# Patient Record
Sex: Female | Born: 1975
Health system: Southern US, Community
[De-identification: ages and names within clinical notes are randomized; demographics above are authoritative.]

## PROBLEM LIST (undated history)

## (undated) DIAGNOSIS — D259 Leiomyoma of uterus, unspecified: Secondary | ICD-10-CM

## (undated) DIAGNOSIS — K219 Gastro-esophageal reflux disease without esophagitis: Secondary | ICD-10-CM

## (undated) DIAGNOSIS — F419 Anxiety disorder, unspecified: Secondary | ICD-10-CM

## (undated) DIAGNOSIS — R51 Headache: Secondary | ICD-10-CM

## (undated) DIAGNOSIS — D649 Anemia, unspecified: Secondary | ICD-10-CM

## (undated) DIAGNOSIS — D509 Iron deficiency anemia, unspecified: Secondary | ICD-10-CM

## (undated) HISTORY — PX: DILATION AND CURETTAGE OF UTERUS: SHX78

## (undated) HISTORY — PX: TOE SURGERY: SHX1073

## (undated) HISTORY — PX: BUNIONECTOMY: SHX129

---

## 2009-07-28 ENCOUNTER — Emergency Department (HOSPITAL_COMMUNITY): Admission: EM | Admit: 2009-07-28 | Discharge: 2009-07-28 | Payer: Self-pay | Admitting: Emergency Medicine

## 2013-01-17 ENCOUNTER — Other Ambulatory Visit: Payer: Self-pay | Admitting: Obstetrics and Gynecology

## 2013-01-17 DIAGNOSIS — D219 Benign neoplasm of connective and other soft tissue, unspecified: Secondary | ICD-10-CM

## 2013-01-26 ENCOUNTER — Ambulatory Visit
Admission: RE | Admit: 2013-01-26 | Discharge: 2013-01-26 | Disposition: A | Discharge status: Home / Self Care | Payer: Managed Care, Other (non HMO) | Source: Ambulatory Visit | Attending: Obstetrics and Gynecology | Admitting: Obstetrics and Gynecology

## 2013-01-26 DIAGNOSIS — D219 Benign neoplasm of connective and other soft tissue, unspecified: Secondary | ICD-10-CM

## 2013-01-26 MED ORDER — GADOBENATE DIMEGLUMINE 529 MG/ML IV SOLN
15.0000 mL | Freq: Once | INTRAVENOUS | Status: AC | PRN
  Administered 2013-01-26: 15 mL via INTRAVENOUS

## 2013-02-05 ENCOUNTER — Other Ambulatory Visit: Payer: Self-pay | Admitting: Obstetrics and Gynecology

## 2013-02-08 ENCOUNTER — Encounter (HOSPITAL_COMMUNITY): Payer: Self-pay | Admitting: Pharmacist

## 2013-02-13 ENCOUNTER — Encounter (HOSPITAL_COMMUNITY)
Admission: RE | Admit: 2013-02-13 | Discharge: 2013-02-13 | Disposition: A | Discharge status: Home / Self Care | Payer: Managed Care, Other (non HMO) | Source: Ambulatory Visit | Attending: Obstetrics and Gynecology | Admitting: Obstetrics and Gynecology

## 2013-02-13 ENCOUNTER — Encounter (HOSPITAL_COMMUNITY): Payer: Self-pay

## 2013-02-13 HISTORY — DX: Anxiety disorder, unspecified: F41.9

## 2013-02-13 HISTORY — DX: Headache: R51

## 2013-02-13 HISTORY — DX: Anemia, unspecified: D64.9

## 2013-02-13 LAB — CBC
HCT: 33.8 % — ABNORMAL LOW (ref 36.0–46.0)
Hemoglobin: 11 g/dL — ABNORMAL LOW (ref 12.0–15.0)
MCHC: 32.5 g/dL (ref 30.0–36.0)
MCV: 80.7 fL (ref 78.0–100.0)
RBC: 4.19 MIL/uL (ref 3.87–5.11)
RDW: 15.5 % (ref 11.5–15.5)
WBC: 4.1 10*3/uL (ref 4.0–10.5)

## 2013-02-13 LAB — ABO/RH: ABO/RH(D): O POS

## 2013-02-13 LAB — TYPE AND SCREEN: Antibody Screen: NEGATIVE

## 2013-02-13 NOTE — Patient Instructions (Addendum)
Your procedure is scheduled on: 02/14/2013  Enter through the Main Entrance of [facility] at: 1015AM  Pick up the phone at the desk and dial [phone].  Call this number if you have problems the morning of surgery: [phone].  Remember: Do NOT eat food: AFTER MIDNIGHT TONIGHT Do NOT drink clear liquids after: AFTER MIDNIGHT TONIGHT Take these medicines the morning of surgery with a SIP OF WATER: XANAX IF NEEDED DAY OF SURGERY  Do NOT wear jewelry (body piercing), make-up, or nail polish. Do NOT wear lotions, powders, or perfumes.  You may wear deoderant. Do NOT shave for 48 hours prior to surgery. Do NOT bring valuables to the hospital. Contacts, dentures, or bridgework may not be worn into surgery. Leave suitcase in car.  After surgery it may be brought to your room.  For patients admitted to the hospital, checkout time is 11:00 AM the day of discharge.

## 2013-02-14 ENCOUNTER — Encounter (HOSPITAL_COMMUNITY): Payer: Managed Care, Other (non HMO) | Admitting: Anesthesiology

## 2013-02-14 ENCOUNTER — Inpatient Hospital Stay (HOSPITAL_COMMUNITY): Payer: Managed Care, Other (non HMO) | Admitting: Anesthesiology

## 2013-02-14 ENCOUNTER — Inpatient Hospital Stay (HOSPITAL_COMMUNITY)
Admission: RE | Admit: 2013-02-14 | Discharge: 2013-02-16 | DRG: 742 | Disposition: A | Discharge status: Home / Self Care | Payer: Managed Care, Other (non HMO) | Source: Ambulatory Visit | Attending: Obstetrics and Gynecology | Admitting: Obstetrics and Gynecology

## 2013-02-14 ENCOUNTER — Encounter (HOSPITAL_COMMUNITY)
Admission: RE | Disposition: A | Discharge status: Home / Self Care | Payer: Self-pay | Source: Ambulatory Visit | Attending: Obstetrics and Gynecology

## 2013-02-14 ENCOUNTER — Encounter (HOSPITAL_COMMUNITY): Payer: Self-pay | Admitting: *Deleted

## 2013-02-14 DIAGNOSIS — D250 Submucous leiomyoma of uterus: Secondary | ICD-10-CM | POA: Diagnosis present

## 2013-02-14 DIAGNOSIS — D251 Intramural leiomyoma of uterus: Principal | ICD-10-CM | POA: Diagnosis present

## 2013-02-14 DIAGNOSIS — N920 Excessive and frequent menstruation with regular cycle: Secondary | ICD-10-CM | POA: Diagnosis present

## 2013-02-14 DIAGNOSIS — N946 Dysmenorrhea, unspecified: Secondary | ICD-10-CM | POA: Diagnosis present

## 2013-02-14 DIAGNOSIS — D252 Subserosal leiomyoma of uterus: Secondary | ICD-10-CM | POA: Diagnosis present

## 2013-02-14 DIAGNOSIS — Z9889 Other specified postprocedural states: Secondary | ICD-10-CM

## 2013-02-14 DIAGNOSIS — D62 Acute posthemorrhagic anemia: Secondary | ICD-10-CM | POA: Diagnosis not present

## 2013-02-14 HISTORY — PX: MYOMECTOMY: SHX85

## 2013-02-14 SURGERY — MYOMECTOMY, ABDOMINAL APPROACH
Anesthesia: General | Site: Abdomen

## 2013-02-14 MED ORDER — DIPHENHYDRAMINE HCL 12.5 MG/5ML PO ELIX: 12.5000 mg | ORAL_SOLUTION | Freq: Four times a day (QID) | ORAL | Status: DC | PRN

## 2013-02-14 MED ORDER — KETOROLAC TROMETHAMINE 30 MG/ML IJ SOLN
INTRAMUSCULAR | Status: AC
  Filled 2013-02-14: qty 1

## 2013-02-14 MED ORDER — CEFAZOLIN SODIUM-DEXTROSE 2-3 GM-% IV SOLR
2.0000 g | INTRAVENOUS | Status: AC
  Administered 2013-02-14: 2 g via INTRAVENOUS

## 2013-02-14 MED ORDER — BUPIVACAINE HCL (PF) 0.25 % IJ SOLN
INTRAMUSCULAR | Status: DC | PRN
  Administered 2013-02-14: 8 mL

## 2013-02-14 MED ORDER — PROPOFOL 10 MG/ML IV EMUL
INTRAVENOUS | Status: AC
  Filled 2013-02-14: qty 20

## 2013-02-14 MED ORDER — VASOPRESSIN 20 UNIT/ML IJ SOLN
INTRAVENOUS | Status: DC | PRN
  Administered 2013-02-14: 14:00:00 via INTRAMUSCULAR

## 2013-02-14 MED ORDER — LACTATED RINGERS IV SOLN
INTRAVENOUS | Status: DC | PRN
  Administered 2013-02-14 (×5): via INTRAVENOUS

## 2013-02-14 MED ORDER — IBUPROFEN 800 MG PO TABS
800.0000 mg | ORAL_TABLET | Freq: Three times a day (TID) | ORAL | Status: DC | PRN
  Administered 2013-02-14 – 2013-02-16 (×4): 800 mg via ORAL
  Filled 2013-02-14 (×4): qty 1

## 2013-02-14 MED ORDER — ONDANSETRON HCL 4 MG/2ML IJ SOLN
INTRAMUSCULAR | Status: AC
  Filled 2013-02-14: qty 2

## 2013-02-14 MED ORDER — ESCITALOPRAM OXALATE 10 MG PO TABS
10.0000 mg | ORAL_TABLET | Freq: Every day | ORAL | Status: DC
  Administered 2013-02-15 – 2013-02-16 (×2): 10 mg via ORAL
  Filled 2013-02-14 (×4): qty 1

## 2013-02-14 MED ORDER — HYDROMORPHONE HCL PF 1 MG/ML IJ SOLN
INTRAMUSCULAR | Status: AC
  Administered 2013-02-14: 0.5 mg via INTRAVENOUS
  Filled 2013-02-14: qty 1

## 2013-02-14 MED ORDER — NEOSTIGMINE METHYLSULFATE 1 MG/ML IJ SOLN
INTRAMUSCULAR | Status: AC
  Filled 2013-02-14: qty 1

## 2013-02-14 MED ORDER — MENTHOL 3 MG MT LOZG: 1.0000 | LOZENGE | OROMUCOSAL | Status: DC | PRN

## 2013-02-14 MED ORDER — DEXAMETHASONE SODIUM PHOSPHATE 10 MG/ML IJ SOLN
INTRAMUSCULAR | Status: AC
  Filled 2013-02-14: qty 1

## 2013-02-14 MED ORDER — ROCURONIUM BROMIDE 100 MG/10ML IV SOLN
INTRAVENOUS | Status: AC
  Filled 2013-02-14: qty 1

## 2013-02-14 MED ORDER — FENTANYL CITRATE 0.05 MG/ML IJ SOLN
INTRAMUSCULAR | Status: DC | PRN
  Administered 2013-02-14 (×9): 50 ug via INTRAVENOUS

## 2013-02-14 MED ORDER — ONDANSETRON HCL 4 MG/2ML IJ SOLN
INTRAMUSCULAR | Status: DC | PRN
  Administered 2013-02-14: 4 mg via INTRAVENOUS

## 2013-02-14 MED ORDER — MIDAZOLAM HCL 2 MG/2ML IJ SOLN
INTRAMUSCULAR | Status: AC
  Filled 2013-02-14: qty 2

## 2013-02-14 MED ORDER — SCOPOLAMINE 1 MG/3DAYS TD PT72
MEDICATED_PATCH | TRANSDERMAL | Status: AC
  Filled 2013-02-14: qty 1

## 2013-02-14 MED ORDER — BUPIVACAINE HCL (PF) 0.25 % IJ SOLN
INTRAMUSCULAR | Status: AC
  Filled 2013-02-14: qty 30

## 2013-02-14 MED ORDER — PHENYLEPHRINE 40 MCG/ML (10ML) SYRINGE FOR IV PUSH (FOR BLOOD PRESSURE SUPPORT)
PREFILLED_SYRINGE | INTRAVENOUS | Status: AC
  Filled 2013-02-14: qty 5

## 2013-02-14 MED ORDER — SODIUM CHLORIDE 0.9 % IJ SOLN
INTRAMUSCULAR | Status: AC
  Filled 2013-02-14: qty 50

## 2013-02-14 MED ORDER — GLYCOPYRROLATE 0.2 MG/ML IJ SOLN
INTRAMUSCULAR | Status: DC | PRN
  Administered 2013-02-14: 0.6 mg via INTRAVENOUS

## 2013-02-14 MED ORDER — FENTANYL CITRATE 0.05 MG/ML IJ SOLN
INTRAMUSCULAR | Status: AC
Start: 2013-02-14 — End: 2013-02-14
  Filled 2013-02-14: qty 5

## 2013-02-14 MED ORDER — CEFAZOLIN SODIUM-DEXTROSE 2-3 GM-% IV SOLR
INTRAVENOUS | Status: AC
  Filled 2013-02-14: qty 50

## 2013-02-14 MED ORDER — DEXTROSE IN LACTATED RINGERS 5 % IV SOLN
INTRAVENOUS | Status: DC
  Administered 2013-02-14 – 2013-02-15 (×3): via INTRAVENOUS

## 2013-02-14 MED ORDER — FENTANYL CITRATE 0.05 MG/ML IJ SOLN
INTRAMUSCULAR | Status: AC
  Filled 2013-02-14: qty 5

## 2013-02-14 MED ORDER — METOCLOPRAMIDE HCL 5 MG/ML IJ SOLN: 10.0000 mg | Freq: Once | INTRAMUSCULAR | Status: DC | PRN

## 2013-02-14 MED ORDER — VASOPRESSIN 20 UNIT/ML IJ SOLN
INTRAMUSCULAR | Status: AC
  Filled 2013-02-14: qty 3

## 2013-02-14 MED ORDER — OXYCODONE-ACETAMINOPHEN 5-325 MG PO TABS
1.0000 | ORAL_TABLET | ORAL | Status: DC | PRN
  Administered 2013-02-15 – 2013-02-16 (×6): 2 via ORAL
  Filled 2013-02-14 (×6): qty 2

## 2013-02-14 MED ORDER — SCOPOLAMINE 1 MG/3DAYS TD PT72
1.0000 | MEDICATED_PATCH | TRANSDERMAL | Status: DC
  Administered 2013-02-14: 1.5 mg via TRANSDERMAL

## 2013-02-14 MED ORDER — PANTOPRAZOLE SODIUM 40 MG PO TBEC
40.0000 mg | DELAYED_RELEASE_TABLET | Freq: Every day | ORAL | Status: DC
  Administered 2013-02-15 – 2013-02-16 (×2): 40 mg via ORAL
  Filled 2013-02-14 (×4): qty 1

## 2013-02-14 MED ORDER — HYDROMORPHONE HCL PF 1 MG/ML IJ SOLN
0.2500 mg | INTRAMUSCULAR | Status: DC | PRN
  Administered 2013-02-14 (×3): 0.5 mg via INTRAVENOUS

## 2013-02-14 MED ORDER — SODIUM CHLORIDE 0.9 % IJ SOLN
INTRAMUSCULAR | Status: AC
  Filled 2013-02-14: qty 40

## 2013-02-14 MED ORDER — MIDAZOLAM HCL 2 MG/2ML IJ SOLN
INTRAMUSCULAR | Status: DC | PRN
  Administered 2013-02-14: 2 mg via INTRAVENOUS

## 2013-02-14 MED ORDER — ONDANSETRON HCL 4 MG PO TABS
4.0000 mg | ORAL_TABLET | Freq: Four times a day (QID) | ORAL | Status: DC | PRN
  Administered 2013-02-15: 4 mg via ORAL
  Filled 2013-02-14: qty 1

## 2013-02-14 MED ORDER — LIDOCAINE HCL (CARDIAC) 20 MG/ML IV SOLN
INTRAVENOUS | Status: DC | PRN
  Administered 2013-02-14: 60 mg via INTRAVENOUS

## 2013-02-14 MED ORDER — ACETAMINOPHEN 10 MG/ML IV SOLN
1000.0000 mg | Freq: Once | INTRAVENOUS | Status: AC
  Administered 2013-02-14: 1000 mg via INTRAVENOUS
  Filled 2013-02-14: qty 100

## 2013-02-14 MED ORDER — ONDANSETRON HCL 4 MG/2ML IJ SOLN: 4.0000 mg | Freq: Four times a day (QID) | INTRAMUSCULAR | Status: DC | PRN

## 2013-02-14 MED ORDER — DIPHENHYDRAMINE HCL 50 MG/ML IJ SOLN: 12.5000 mg | Freq: Four times a day (QID) | INTRAMUSCULAR | Status: DC | PRN

## 2013-02-14 MED ORDER — HYDROMORPHONE 0.3 MG/ML IV SOLN
INTRAVENOUS | Status: DC
  Administered 2013-02-14: 1.19 mg via INTRAVENOUS
  Administered 2013-02-14: 17:00:00 via INTRAVENOUS
  Administered 2013-02-15: 0.599 mg via INTRAVENOUS
  Administered 2013-02-15: 0.4 mg via INTRAVENOUS
  Filled 2013-02-14: qty 25

## 2013-02-14 MED ORDER — PHENYLEPHRINE HCL 10 MG/ML IJ SOLN
INTRAMUSCULAR | Status: DC | PRN
  Administered 2013-02-14 (×8): 40 ug via INTRAVENOUS

## 2013-02-14 MED ORDER — LACTATED RINGERS IV SOLN
INTRAVENOUS | Status: DC
  Administered 2013-02-14 (×5): via INTRAVENOUS

## 2013-02-14 MED ORDER — DEXAMETHASONE SODIUM PHOSPHATE 10 MG/ML IJ SOLN
INTRAMUSCULAR | Status: DC | PRN
  Administered 2013-02-14: 10 mg via INTRAVENOUS

## 2013-02-14 MED ORDER — NEOSTIGMINE METHYLSULFATE 1 MG/ML IJ SOLN
INTRAMUSCULAR | Status: DC | PRN
  Administered 2013-02-14: 3 mg via INTRAVENOUS

## 2013-02-14 MED ORDER — ZOLPIDEM TARTRATE 5 MG PO TABS: 5.0000 mg | ORAL_TABLET | Freq: Every evening | ORAL | Status: DC | PRN

## 2013-02-14 MED ORDER — METHYLENE BLUE 1 % INJ SOLN
INTRAMUSCULAR | Status: AC
  Filled 2013-02-14: qty 10

## 2013-02-14 MED ORDER — SODIUM CHLORIDE 0.9 % IJ SOLN: 9.0000 mL | INTRAMUSCULAR | Status: DC | PRN

## 2013-02-14 MED ORDER — PROPOFOL 10 MG/ML IV BOLUS
INTRAVENOUS | Status: DC | PRN
  Administered 2013-02-14: 150 mg via INTRAVENOUS

## 2013-02-14 MED ORDER — NALOXONE HCL 0.4 MG/ML IJ SOLN: 0.4000 mg | INTRAMUSCULAR | Status: DC | PRN

## 2013-02-14 MED ORDER — GLYCOPYRROLATE 0.2 MG/ML IJ SOLN
INTRAMUSCULAR | Status: AC
  Filled 2013-02-14: qty 3

## 2013-02-14 MED ORDER — HYDROMORPHONE HCL PF 1 MG/ML IJ SOLN
INTRAMUSCULAR | Status: AC
  Filled 2013-02-14: qty 1

## 2013-02-14 MED ORDER — ROCURONIUM BROMIDE 100 MG/10ML IV SOLN
INTRAVENOUS | Status: DC | PRN
  Administered 2013-02-14: 40 mg via INTRAVENOUS
  Administered 2013-02-14 (×3): 10 mg via INTRAVENOUS
  Administered 2013-02-14: 5 mg via INTRAVENOUS
  Administered 2013-02-14: 10 mg via INTRAVENOUS

## 2013-02-14 MED ORDER — HYDROMORPHONE HCL PF 1 MG/ML IJ SOLN: 0.2000 mg | INTRAMUSCULAR | Status: DC | PRN

## 2013-02-14 MED ORDER — MEPERIDINE HCL 25 MG/ML IJ SOLN: 6.2500 mg | INTRAMUSCULAR | Status: DC | PRN

## 2013-02-14 MED ORDER — LIDOCAINE HCL (CARDIAC) 20 MG/ML IV SOLN
INTRAVENOUS | Status: AC
  Filled 2013-02-14: qty 5

## 2013-02-14 SURGICAL SUPPLY — 45 items
BARRIER ADHS 3X4 INTERCEED (GAUZE/BANDAGES/DRESSINGS) ×6 IMPLANT
CANISTER SUCT 3000ML (MISCELLANEOUS) ×2 IMPLANT
CATH FOLEY 2WAY  3CC  8FR (CATHETERS)
CATH FOLEY 2WAY 3CC 8FR (CATHETERS) IMPLANT
CLOTH BEACON ORANGE TIMEOUT ST (SAFETY) ×2 IMPLANT
CONT PATH 16OZ SNAP LID 3702 (MISCELLANEOUS) ×2 IMPLANT
CONT SPEC PATH 64OZ SNAP LID (MISCELLANEOUS) ×2 IMPLANT
DECANTER SPIKE VIAL GLASS SM (MISCELLANEOUS) ×2 IMPLANT
DRAPE CESAREAN BIRTH W POUCH (DRAPES) ×2 IMPLANT
DRSG OPSITE POSTOP 4X10 (GAUZE/BANDAGES/DRESSINGS) ×2 IMPLANT
ELECT NEEDLE TIP 2.8 STRL (NEEDLE) ×2 IMPLANT
FILTER STRAW FLUID ASPIR (MISCELLANEOUS) IMPLANT
GAUZE SPONGE 4X4 12PLY STRL LF (GAUZE/BANDAGES/DRESSINGS) IMPLANT
GAUZE SPONGE 4X4 16PLY XRAY LF (GAUZE/BANDAGES/DRESSINGS) IMPLANT
GLOVE BIOGEL PI IND STRL 7.0 (GLOVE) ×3 IMPLANT
GLOVE BIOGEL PI INDICATOR 7.0 (GLOVE) ×3
GLOVE ECLIPSE 6.5 STRL STRAW (GLOVE) ×2 IMPLANT
GOWN PREVENTION PLUS LG XLONG (DISPOSABLE) ×6 IMPLANT
IV STOPCOCK 4 WAY 40  W/Y SET (IV SOLUTION)
IV STOPCOCK 4 WAY 40 W/Y SET (IV SOLUTION) IMPLANT
NEEDLE HYPO 25X1 1.5 SAFETY (NEEDLE) ×2 IMPLANT
NEEDLE SPNL 22GX9.0 ACCUTG (NEEDLE) IMPLANT
NS IRRIG 1000ML POUR BTL (IV SOLUTION) ×2 IMPLANT
PACK ABDOMINAL GYN (CUSTOM PROCEDURE TRAY) ×2 IMPLANT
PAD OB MATERNITY 4.3X12.25 (PERSONAL CARE ITEMS) ×2 IMPLANT
SPONGE LAP 18X18 X RAY DECT (DISPOSABLE) ×10 IMPLANT
STAPLER VISISTAT 35W (STAPLE) ×2 IMPLANT
SUT MNCRL AB 4-0 PS2 18 (SUTURE) IMPLANT
SUT MON AB 3-0 SH 27 (SUTURE) ×4
SUT MON AB 3-0 SH27 (SUTURE) ×4 IMPLANT
SUT PLAIN 2 0 (SUTURE) ×1
SUT PLAIN ABS 2-0 CT1 27XMFL (SUTURE) ×1 IMPLANT
SUT VIC AB 0 CT1 18XCR BRD8 (SUTURE) ×3 IMPLANT
SUT VIC AB 0 CT1 36 (SUTURE) ×4 IMPLANT
SUT VIC AB 0 CT1 8-18 (SUTURE) ×3
SUT VIC AB 2-0 CT1 27 (SUTURE) ×1
SUT VIC AB 2-0 CT1 TAPERPNT 27 (SUTURE) ×1 IMPLANT
SUT VIC AB 2-0 SH 27 (SUTURE) ×2
SUT VIC AB 2-0 SH 27XBRD (SUTURE) ×2 IMPLANT
SUT VIC AB 4-0 PS2 27 (SUTURE) IMPLANT
SYR 50ML LL SCALE MARK (SYRINGE) ×2 IMPLANT
SYR CONTROL 10ML LL (SYRINGE) ×2 IMPLANT
TOWEL OR 17X24 6PK STRL BLUE (TOWEL DISPOSABLE) ×4 IMPLANT
TRAY FOLEY CATH 14FR (SET/KITS/TRAYS/PACK) ×2 IMPLANT
WATER STERILE IRR 1000ML POUR (IV SOLUTION) IMPLANT

## 2013-02-14 NOTE — Anesthesia Preprocedure Evaluation (Signed)
Anesthesia Evaluation  Patient identified by MRN, date of birth, ID band Patient awake    Reviewed: Allergy & Precautions, H&P , NPO status , Patient's Chart, lab work & pertinent test results  Airway Mallampati: II TM Distance: >3 FB Neck ROM: full    Dental no notable dental hx. (+) Teeth Intact   Pulmonary neg pulmonary ROS,  breath sounds clear to auscultation  Pulmonary exam normal       Cardiovascular negative cardio ROS  Rhythm:regular Rate:Normal     Neuro/Psych  Headaches, negative psych ROS   GI/Hepatic negative GI ROS, Neg liver ROS, GERD-  Medicated,  Endo/Other  negative endocrine ROS  Renal/GU negative Renal ROS  negative genitourinary   Musculoskeletal negative musculoskeletal ROS (+)   Abdominal Normal abdominal exam  (+)   Peds  Hematology  (+) anemia ,   Anesthesia Other Findings   Reproductive/Obstetrics Fibroid uterus                           Anesthesia Physical Anesthesia Plan  ASA: II  Anesthesia Plan: General   Post-op Pain Management:    Induction: Intravenous  Airway Management Planned: Oral ETT  Additional Equipment:   Intra-op Plan:   Post-operative Plan: Extubation in OR  Informed Consent: I have reviewed the patients History and Physical, chart, labs and discussed the procedure including the risks, benefits and alternatives for the proposed anesthesia with the patient or authorized representative who has indicated his/her understanding and acceptance.   Dental advisory given  Plan Discussed with: Anesthesiologist, CRNA and Surgeon  Anesthesia Plan Comments:         Anesthesia Quick Evaluation

## 2013-02-14 NOTE — Brief Op Note (Signed)
02/14/2013  2:39 PM  PATIENT:  Diana Craig  37 y.o. female  PRE-OPERATIVE DIAGNOSIS:  Menorrhagia, dysmenorrhea, uterine fibroids  POST-OPERATIVE DIAGNOSIS:  IM/SS/SM fibroids, dysmenorrhea, menorrhagia  PROCEDURE:  Exploratory laparotomy, multiple myomectomy  SURGEON:  Surgeon(s) and Role:    * Dashon Mcintire Cathie Beams, MD - Primary  PHYSICIAN ASSISTANT:   ASSISTANTS: Marlinda Mike, CNM   ANESTHESIA:   general Findings: multiple uterine fibroids, nl tubes and ovaries EBL:  Total I/O In: 3100 [I.V.:3100] Out: 775 [Urine:175; Blood:600]  BLOOD ADMINISTERED:none  DRAINS: none   LOCAL MEDICATIONS USED:  MARCAINE     SPECIMEN:  Source of Specimen:  myoma x 52  DISPOSITION OF SPECIMEN:  PATHOLOGY  COUNTS:  YES  TOURNIQUET:  * No tourniquets in log *  DICTATION: .Other Dictation: Dictation Number [id number]  PLAN OF CARE: Admit to inpatient   PATIENT DISPOSITION:  PACU - hemodynamically stable.   Delay start of Pharmacological VTE agent (>24hrs) due to surgical blood loss or risk of bleeding: no

## 2013-02-14 NOTE — Transfer of Care (Signed)
Immediate Anesthesia Transfer of Care Note  Patient: Diana Craig  Procedure(s) Performed: Procedure(s) with comments: Exploratory Laparotomy MYOMECTOMY (N/A) - 2 1/2 hrs.  Patient Location: PACU  Anesthesia Type:General  Level of Consciousness: awake, alert , oriented and patient cooperative  Airway & Oxygen Therapy: Patient Spontanous Breathing and Patient connected to nasal cannula oxygen  Post-op Assessment: Report given to PACU RN and Post -op Vital signs reviewed and stable  Post vital signs: Reviewed and stable  Complications: No apparent anesthesia complications

## 2013-02-14 NOTE — Anesthesia Postprocedure Evaluation (Signed)
Anesthesia Post Note  Patient: Diana Craig  Procedure(s) Performed: Procedure(s) (LRB): Exploratory Laparotomy MYOMECTOMY (N/A)  Anesthesia type: General  Patient location: PACU  Post pain: Pain level controlled  Post assessment: Post-op Vital signs reviewed  Last Vitals:  Filed Vitals:   02/14/13 1515  BP: 111/57  Pulse: 73  Temp:   Resp: 14    Post vital signs: Reviewed  Level of consciousness: sedated  Complications: No apparent anesthesia complications

## 2013-02-15 ENCOUNTER — Encounter (HOSPITAL_COMMUNITY): Payer: Self-pay | Admitting: Obstetrics and Gynecology

## 2013-02-15 LAB — CBC
HCT: 25.1 % — ABNORMAL LOW (ref 36.0–46.0)
Hemoglobin: 8.1 g/dL — ABNORMAL LOW (ref 12.0–15.0)
MCH: 26.3 pg (ref 26.0–34.0)
MCHC: 32.3 g/dL (ref 30.0–36.0)
RBC: 3.08 MIL/uL — ABNORMAL LOW (ref 3.87–5.11)
WBC: 8.4 10*3/uL (ref 4.0–10.5)

## 2013-02-15 MED ORDER — POLYSACCHARIDE IRON COMPLEX 150 MG PO CAPS
150.0000 mg | ORAL_CAPSULE | Freq: Two times a day (BID) | ORAL | Status: DC
  Administered 2013-02-15 – 2013-02-16 (×3): 150 mg via ORAL
  Filled 2013-02-15 (×5): qty 1

## 2013-02-15 NOTE — Op Note (Signed)
NAMECAMY, LEDER         ACCOUNT NO.:  [id number]  MEDICAL RECORD NO.:  [id number]  LOCATION:  9303                          FACILITY:  [facility]  PHYSICIAN:  Maxie Better, M.D.DATE OF BIRTH:  December 21, 1975  DATE OF PROCEDURE:  02/14/2013 DATE OF DISCHARGE:                              OPERATIVE REPORT   PREOPERATIVE DIAGNOSES:  Menorrhagia, dysmenorrhea, uterine fibroids.  PROCEDURES:  Exploratory laparotomy, multiple myomectomy.  POSTOPERATIVE DIAGNOSES:  Intramural subserosal submucosal fibroid menorrhagia, dysmenorrhea.  ANESTHESIA:  General.  SURGEON:  Maxie Better, MD.  ASSISTANTMarlinda Mike, CNM  DESCRIPTION OF PROCEDURE:  Under adequate general anesthesia, the patient was placed in a supine position.  She was sterilely prepped and draped in the usual fashion.  An indwelling Foley catheter was sterilely placed.  A 0.25% Marcaine was injected along the planned Pfannenstiel skin incision site.  Pfannenstiel skin incision was made, carried down to the rectus fascia.  Rectus fascia was opened transversely.  Rectus fascia was then bluntly and sharply dissected off the rectus muscle in superior and inferior fashion.  The rectus muscle was split in the midline.  The parietal peritoneum was entered sharply and extended.  The uterus was then exteriorized.  Normal tubes and ovaries were noted bilaterally.  The fundus was identified using the fallopian tubes as the guidelines.  Multiple fibroids were noted of the posterior as well as the anterior aspect of the uterus.  Dilute solution of Pitressin was injected in midline posteriorly along the midline.  An incision was then made and multiple fibroids were removed through that incision.  An incision was then extended just below the fundal region posteriorly and additional fibroids were removed at that time.  Once fibroids that could be removed through that incision that had been removed, 0 Vicryl figure- of-eight  suture was then used to close the spaces of the defect carried up to the serosal surface which was then closed with 3-0 Monocryl suture in a baseball stitch.  Attention was then turned to the anterior aspect of the uterus.  Additional dilute Pitressin was also injected and a vertical incision was made over the prominent fibroid through that incision.  All the fibroids were also removed.  The endometrial cavity was noted to be entered posteriorly or anteriorly and when all palpable fibroids was able to be removed through that incision the incision was then closed with interrupted 0 Vicryl figure-of-eight sutures carried up to the serosal surface, then also closed with 3-0 Monocryl sutures.  Deep spaces were closed with interrupted 0 Vicryl sutures.  There was a fibroid on the left posterior aspect of the uterus above the utero- ovarian ligaments, and this was also excised in the right posterior lower uterine segment about 4 cm fibroid was also injected with a dilute solution of Pitressin.  Horizontal incision was then made and the fibroid enucleated and additional fibroids below that was also removed through same incision. Deep spaces were also closed with 0 Vicryl figure-of-eight sutures, carried up to the serosal surface, that also was then closed with 3-0 Monocryl suture.  Once good hemostasis was noted throughout and all palpable fibroids removed, the pelvis was irrigated and suctioned of debris.  Total of 52 fibroids were  removed.  Interceed x2 was placed in the lower portion of the incisions posteriorly and one was also placed anteriorly as well.  The uterus was returned to the abdomen initially with Interceed placed posteriorly and the other Interceed placed anteriorly once the uterus was returned to the abdomen.  The parietal peritoneum was then closed with 2-0 Vicryl.  The rectus fascia was closed with 0 Vicryl x2.  The subcutaneous areas irrigated, small bleeders cauterized.   Interrupted 2-0 plain sutures placed and the skin approximated with Ethicon staples.  SPECIMEN:  Myoma x2 was sent to pathology.  ESTIMATED BLOOD LOSS:  600 mL.  FLUID:  2 L.  URINE OUTPUT:  175 mL.  COUNTS:  Sponge and instrument counts x2 was correct.  COMPLICATION:  None.  The patient tolerated procedure well, was transferred to recovery in stable condition.  She will need a cesarean section for future deliveries.     Maxie Better, M.D.     [place]/MEDQ  D:  02/14/2013  T:  02/15/2013  Job:  [id number]

## 2013-02-15 NOTE — Anesthesia Postprocedure Evaluation (Signed)
  Anesthesia Post-op Note  Patient: Diana Craig  Procedure(s) Performed: Procedure(s) with comments: Exploratory Laparotomy MYOMECTOMY (N/A) - 2 1/2 hrs.  Patient Location: Women's Unit  Anesthesia Type:General  Level of Consciousness: awake, alert  and oriented  Airway and Oxygen Therapy: Patient Spontanous Breathing and Patient connected to nasal cannula oxygen  Post-op Pain: mild  Post-op Assessment: Post-op Vital signs reviewed and Patient's Cardiovascular Status Stable  Post-op Vital Signs: Reviewed and stable  Complications: No apparent anesthesia complications

## 2013-02-15 NOTE — Progress Notes (Signed)
Subjective: Patient reports incisional pain.   No flatus as yet. C/o temporal h/a. No void as yet  Objective: I have reviewed patient's vital signs.  vital signs, intake and output, medications and labs. Filed Vitals:   02/15/13 0621  BP: 107/61  Pulse: 70  Temp: 98.4 F (36.9 C)  Resp: 14   I/O last 3 completed shifts: In: 4550 [I.V.:4550] Out: 1650 [Urine:1050; Blood:600]    Lab Results  Component Value Date   WBC 8.4 02/15/2013   HGB 8.1* 02/15/2013   HCT 25.1* 02/15/2013   MCV 81.5 02/15/2013   PLT 188 02/15/2013   No results found for this basename: CREATININE    EXAM General: alert, cooperative and mild distress Resp: clear to auscultation bilaterally Cardio: regular rate and rhythm, S1, S2 normal, no murmur, click, rub or gallop GI: soft nondistended hypoactive BS incision intact small stain on right Extremities: no edema, redness or tenderness in the calves or thighs Vaginal Bleeding: minimal  Assessment: s/p Procedure(s): Exploratory Laparotomy MYOMECTOMY: stable and anemia Anemia related to surgery blood loss  Plan: Advance diet Encourage ambulation Advance to PO medication Discontinue IV fluids  Start iron supplement with flatus  LOS: 1 day    Ishmel Acevedo A, MD 02/15/2013 7:55 AM    02/15/2013, 7:55 AM

## 2013-02-16 MED ORDER — OXYCODONE-ACETAMINOPHEN 5-325 MG PO TABS: 1.0000 | ORAL_TABLET | ORAL | Status: DC | PRN

## 2013-02-16 MED ORDER — IBUPROFEN 800 MG PO TABS: 800.0000 mg | ORAL_TABLET | Freq: Four times a day (QID) | ORAL | Status: DC | PRN

## 2013-02-16 NOTE — Progress Notes (Signed)
Subjective: Patient reports tolerating PO, + flatus and no problems voiding.    Objective: I have reviewed patient's vital signs.  vital signs, intake and output and labs. Filed Vitals:   02/16/13 1200  BP: 109/66  Pulse: 72  Temp: 98.3 F (36.8 C)  Resp: 18   I/O last 3 completed shifts: In: 1850 [P.O.:600; I.V.:1250] Out: 1850 [Urine:1850]    Lab Results  Component Value Date   WBC 8.4 02/15/2013   HGB 8.1* 02/15/2013   HCT 25.1* 02/15/2013   MCV 81.5 02/15/2013   PLT 188 02/15/2013   No results found for this basename: CREATININE    EXAM General: alert, cooperative and no distress Resp: clear to auscultation bilaterally Cardio: regular rate and rhythm, S1, S2 normal, no murmur, click, rub or gallop GI: incision: clean, dry and intact and soft nondistended (+) BS Extremities: no edema, redness or tenderness in the calves or thighs Vaginal Bleeding: minimal  Assessment: s/p Procedure(s): Exploratory Laparotomy MYOMECTOMY: stable, progressing well, tolerating diet and anemia Anemia due to acute blood loss and iron deficiency anemia Plan: Advance diet Advance to PO medication Discontinue IV fluids Discharge home Staple removal in office 12/`16 or 12/17 D/c instructions reviewed Continue iron tablet at home Script: percocet, motrin   LOS: 2 days    Gordie Crumby A, MD 02/16/2013 4:37 PM    02/16/2013, 4:37 PM

## 2013-02-16 NOTE — Progress Notes (Signed)
Discharge instructions reviewed with patient.  Patient states understanding of home care, medications, activity, signs/symptoms to report to MD and return MD office visit.  No home equipment needed.  Patient ambulated for discharge in stable condition with staff without incident.  

## 2013-02-16 NOTE — Discharge Summary (Signed)
Physician Discharge Summary  Patient ID: Diana Craig MRN: [id number] DOB/AGE: September 23, 1975 37 y.o.  Admit date: 02/14/2013 Discharge date: 02/16/2013  Admission Diagnoses: menorrhagia, dysmenorrhea, uterine fibroids  Discharge Diagnoses: menorrhagia, dysmenorrhea, SS/IM/SM uterine fibroids Active Problems:   S/P myomectomy   Discharged Condition: stable  Hospital Course: pt was admitted to [facility]. She underwent exploratory laparotomy, multiple myomectomy. Pt had uncomplicated postop course  Consults: None  Significant Diagnostic Studies: labs:  CBC    Component Value Date/Time   WBC 8.4 02/15/2013 0525   RBC 3.08* 02/15/2013 0525   HGB 8.1* 02/15/2013 0525   HCT 25.1* 02/15/2013 0525   PLT 188 02/15/2013 0525   MCV 81.5 02/15/2013 0525   MCH 26.3 02/15/2013 0525   MCHC 32.3 02/15/2013 0525   RDW 15.7* 02/15/2013 0525      Treatments: surgery: exploratory laparotomy, multiple myomectomy  Discharge Exam: Blood pressure 109/66, pulse 72, temperature 98.3 F (36.8 C), temperature source Oral, resp. rate 18, height 5\' 3"  (1.6 m), weight 78.926 kg (174 lb), last menstrual period 01/31/2013, SpO2 96.00%. General appearance: alert, cooperative and no distress Resp: clear to auscultation bilaterally Cardio: regular rate and rhythm, S1, S2 normal, no murmur, click, rub or gallop GI: soft, nondistended (+) BS  Pelvic: deferred Extremities: no edema, redness or tenderness in the calves or thighs Incision/Wound:intact dry  Disposition: Final discharge disposition not confirmed  Discharge Orders   Future Orders Complete By Expires   Diet general  As directed    Discharge instructions  As directed    Comments:     Call if temperature greater than equal to 100.4, nothing per vagina for 4-6 weeks or severe nausea vomiting, increased incisional pain , drainage or redness in the incision site, no straining with bowel movements, showers no bath   Discharge patient  As  directed    Discharge wound care:  As directed    Comments:     Staple removal in office Tuesday or wednesday   Increase activity slowly  As directed        Medication List         ALPRAZolam 0.25 MG tablet  Commonly known as:  XANAX  Take 0.25 mg by mouth daily as needed for anxiety.     escitalopram 10 MG tablet  Commonly known as:  LEXAPRO  Take 10 mg by mouth daily.     eszopiclone 3 MG Tabs  Generic drug:  Eszopiclone  Take 3 mg by mouth at bedtime. Take immediately before bedtime     FUSION PLUS Caps  Take 1 capsule by mouth daily.     ibuprofen 800 MG tablet  Commonly known as:  ADVIL,MOTRIN  Take 1 tablet (800 mg total) by mouth every 6 (six) hours as needed.     oxyCODONE-acetaminophen 5-325 MG per tablet  Commonly known as:  PERCOCET/ROXICET  Take 1-2 tablets by mouth every 4 (four) hours as needed for severe pain (moderate to severe pain (when tolerating fluids)).     phentermine 37.5 MG capsule  Take 37.5 mg by mouth every morning.     Vitamin D (Ergocalciferol) 50000 UNITS Caps capsule  Commonly known as:  DRISDOL  Take 50,000 Units by mouth 2 (two) times a week. On Tuesdays and Fridays           Follow-up Information   Follow up with Serita Kyle, MD.   Specialty:  Obstetrics and Gynecology   Contact information:   [address] [place] [address] [phone]  Signed: Haillee Johann A 02/16/2013, 5:40 PM

## 2014-05-08 ENCOUNTER — Ambulatory Visit: Payer: Self-pay | Admitting: Podiatry

## 2014-05-15 ENCOUNTER — Encounter: Payer: Self-pay | Admitting: Podiatry

## 2014-05-15 ENCOUNTER — Ambulatory Visit (INDEPENDENT_AMBULATORY_CARE_PROVIDER_SITE_OTHER): Payer: Managed Care, Other (non HMO) | Admitting: Podiatry

## 2014-05-15 ENCOUNTER — Ambulatory Visit: Payer: Self-pay

## 2014-05-15 VITALS — BP 106/68 | HR 84 | Resp 16

## 2014-05-15 DIAGNOSIS — M898X0 Other specified disorders of bone, multiple sites: Secondary | ICD-10-CM

## 2014-05-15 DIAGNOSIS — Q786 Multiple congenital exostoses: Secondary | ICD-10-CM

## 2014-05-15 DIAGNOSIS — M2012 Hallux valgus (acquired), left foot: Secondary | ICD-10-CM | POA: Diagnosis not present

## 2014-05-15 DIAGNOSIS — M79674 Pain in right toe(s): Secondary | ICD-10-CM | POA: Diagnosis not present

## 2014-05-15 DIAGNOSIS — M79675 Pain in left toe(s): Secondary | ICD-10-CM | POA: Diagnosis not present

## 2014-05-15 DIAGNOSIS — M21612 Bunion of left foot: Secondary | ICD-10-CM

## 2014-05-15 NOTE — Progress Notes (Signed)
   Subjective:    Patient ID: Diana Craig, female    DOB: Aug 02, 1975, 39 y.o.   MRN: [id number]  HPI  Pt presents with pain in right toes 2nd met and pain in left 2,3 met, both had prior surgeries  Review of Systems  All other systems reviewed and are negative.      Objective:   Physical Exam        Assessment & Plan:

## 2014-05-15 NOTE — Patient Instructions (Signed)
Pre-Operative Instructions  Congratulations, you have decided to take an important step to improving your quality of life.  You can be assured that the doctors of [facility] will be with you every step of the way.  1. Plan to be at the surgery center/hospital at least 1 (one) hour prior to your scheduled time unless otherwise directed by the surgical center/hospital staff.  You must have a responsible adult accompany you, remain during the surgery and drive you home.  Make sure you have directions to the surgical center/hospital and know how to get there on time. 2. For hospital based surgery you will need to obtain a history and physical form from your family physician within 1 month prior to the date of surgery- we will give you a form for you primary physician.  3. We make every effort to accommodate the date you request for surgery.  There are however, times where surgery dates or times have to be moved.  We will contact you as soon as possible if a change in schedule is required.   4. No Aspirin/Ibuprofen for one week before surgery.  If you are on aspirin, any non-steroidal anti-inflammatory medications (Mobic, Aleve, Ibuprofen) you should stop taking it 7 days prior to your surgery.  You make take Tylenol  For pain prior to surgery.  5. Medications- If you are taking daily heart and blood pressure medications, seizure, reflux, allergy, asthma, anxiety, pain or diabetes medications, make sure the surgery center/hospital is aware before the day of surgery so they may notify you which medications to take or avoid the day of surgery. 6. No food or drink after midnight the night before surgery unless directed otherwise by surgical center/hospital staff. 7. No alcoholic beverages 24 hours prior to surgery.  No smoking 24 hours prior to or 24 hours after surgery. 8. Wear loose pants or shorts- loose enough to fit over bandages, boots, and casts. 9. No slip on shoes, sneakers are best. 10. Bring  your boot with you to the surgery center/hospital.  Also bring crutches or a walker if your physician has prescribed it for you.  If you do not have this equipment, it will be provided for you after surgery. 11. If you have not been contracted by the surgery center/hospital by the day before your surgery, call to confirm the date and time of your surgery. 12. Leave-time from work may vary depending on the type of surgery you have.  Appropriate arrangements should be made prior to surgery with your employer. 13. Prescriptions will be provided immediately following surgery by your doctor.  Have these filled as soon as possible after surgery and take the medication as directed. 14. Remove nail polish on the operative foot. 15. Wash the night before surgery.  The night before surgery wash the foot and leg well with the antibacterial soap provided and water paying special attention to beneath the toenails and in between the toes.  Rinse thoroughly with water and dry well with a towel.  Perform this wash unless told not to do so by your physician.  Enclosed: 1 Ice pack (please put in freezer the night before surgery)   1 Hibiclens skin cleaner   Pre-op Instructions  If you have any questions regarding the instructions, do not hesitate to call our office.  [place]: [address] , [place] [address] [phone]  [place]: [address] [place], [place] [address] [phone]  [place]: [address]  [place], [place] [address] [phone]  Dr. Richard   Tuchman DPM, Dr. Norman Regal DPM Dr. Richard Sikora DPM, Dr. M. Todd Hyatt DPM, Dr. Kathryn Egerton DPM 

## 2014-05-16 NOTE — Progress Notes (Signed)
Subjective:     Patient ID: Diana Craig, female   DOB: 1975/06/22, 39 y.o.   MRN: [id number]  HPI patient presents stating I have had a lot of discomfort in the second toes of both feet and also I have a bump on my right big toe a for where I had my previous bunion corrected and I have a structural bunion deformity left think it's painful with shoe gear. States that this is been going on for a long time and she's tried modification and shoe gear she's tried soaks and anti-inflammatories without relief of symptoms   Review of Systems  All other systems reviewed and are negative.      Objective:   Physical Exam  Constitutional: She is oriented to person, place, and time.  Cardiovascular: Intact distal pulses.   Musculoskeletal: Normal range of motion.  Neurological: She is oriented to person, place, and time.  Skin: Skin is warm.  Nursing note and vitals reviewed.  neurovascular status intact with muscle strength adequate and range of motion subtalar midtarsal joint within normal limits. Digits are noted to be well perfused she's well oriented 3 and does not have equinus condition. I noted a hyperostosis medial aspect first metatarsal head left that red elongated second toes of both feet with keratotic lesions on the proximal phalanx and a exostosis on the right interphalangeal joint of the big toe that's painful when pressed. 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 Patient wants to have these fixed and at this time I spent a great of time going over her problems and considerations conservatively and surgically. She has tried conservative  treatment she did end up doing well with the right one and she would like the left one fixed the same way and since she is aware of surgery and is had previous surgery she wants to do her consent form today. I allowed her to read the consent form reviewing with her all possible complications as outlined and alternative treatments. She understands total recovery. We'll take 6 months to one year and at this time she is scheduled for outpatient surgery ring spurs but she surgical center in the next few weeks and is given all preoperative instructions. We went ahead today and we did dispense air fracture walker with instructions on usage and she is encouraged to call with any questions prior to procedures. Proposed procedures Austin bunionectomy left with pin fixation digital fusion with shortening component second digit of both feet and removal of bone spur from the right interphalangeal joint of the hallux

## 2014-06-03 ENCOUNTER — Encounter: Payer: Self-pay | Admitting: Podiatry

## 2014-06-03 DIAGNOSIS — M2570 Osteophyte, unspecified joint: Secondary | ICD-10-CM | POA: Diagnosis not present

## 2014-06-03 DIAGNOSIS — M2012 Hallux valgus (acquired), left foot: Secondary | ICD-10-CM | POA: Diagnosis not present

## 2014-06-03 DIAGNOSIS — M2041 Other hammer toe(s) (acquired), right foot: Secondary | ICD-10-CM | POA: Diagnosis not present

## 2014-06-03 DIAGNOSIS — M2042 Other hammer toe(s) (acquired), left foot: Secondary | ICD-10-CM | POA: Diagnosis not present

## 2014-06-09 ENCOUNTER — Ambulatory Visit (INDEPENDENT_AMBULATORY_CARE_PROVIDER_SITE_OTHER): Payer: Managed Care, Other (non HMO)

## 2014-06-09 ENCOUNTER — Ambulatory Visit (INDEPENDENT_AMBULATORY_CARE_PROVIDER_SITE_OTHER): Payer: Managed Care, Other (non HMO) | Admitting: Podiatry

## 2014-06-09 VITALS — Temp 97.5°F

## 2014-06-09 DIAGNOSIS — Z9889 Other specified postprocedural states: Secondary | ICD-10-CM

## 2014-06-09 DIAGNOSIS — M2012 Hallux valgus (acquired), left foot: Secondary | ICD-10-CM

## 2014-06-09 DIAGNOSIS — M21612 Bunion of left foot: Secondary | ICD-10-CM

## 2014-06-09 DIAGNOSIS — M79675 Pain in left toe(s): Secondary | ICD-10-CM

## 2014-06-09 DIAGNOSIS — M79673 Pain in unspecified foot: Secondary | ICD-10-CM

## 2014-06-09 MED ORDER — HYDROCODONE-ACETAMINOPHEN 10-325 MG PO TABS: 1.0000 | ORAL_TABLET | Freq: Three times a day (TID) | ORAL | Status: DC | PRN

## 2014-06-10 NOTE — Progress Notes (Signed)
DOS 06/03/2014 Austin Bunionectomy (cutting and moving bone) with pin fixation left foot, fusion with pin 2nd toes both feet, removal bone spur right big toe.

## 2014-06-11 NOTE — Progress Notes (Signed)
Subjective:     Patient ID: Diana Craig, female   DOB: 01-31-1976, 39 y.o.   MRN: [id number]  HPI patient states I'm doing real well with mild pain and swelling if I been on my feet for too long but I am able to walk   Review of Systems     Objective:   Physical Exam Neurovascular status intact negative Homans sign noted with well-healing surgical sites left first metatarsal second digit bilateral and right big toe with wound edges coapted well and good alignment noted    Assessment:     Doing well post forefoot surgery bilateral with good stabilization of the digits noted    Plan:     Reapplied sterile dressing and reviewed x-ray with patient. Explained continued elevation compression and immobilization and reappoint 2 weeks for stitch removal or earlier if needed

## 2014-06-23 ENCOUNTER — Ambulatory Visit (INDEPENDENT_AMBULATORY_CARE_PROVIDER_SITE_OTHER): Payer: Managed Care, Other (non HMO) | Admitting: Podiatry

## 2014-06-23 DIAGNOSIS — Z9889 Other specified postprocedural states: Secondary | ICD-10-CM

## 2014-06-23 DIAGNOSIS — M79674 Pain in right toe(s): Secondary | ICD-10-CM

## 2014-06-23 DIAGNOSIS — M79675 Pain in left toe(s): Secondary | ICD-10-CM

## 2014-06-24 NOTE — Progress Notes (Signed)
Subjective:     Patient ID: Diana Craig, female   DOB: December 07, 1975, 39 y.o.   MRN: [id number]  HPI patient states that she's doing well with her surgery and walking well   Review of Systems     Objective:   Physical Exam Her vascular status intact with structural bunion correction good left and spur removal right hallux noted to be good with wound edges well coapted and stitches intact second toe bilateral with pins in place and good alignment noted    Assessment:     Doing well post osteotomy and digital fusion procedures    Plan:     X-rays reviewed stitches removed and I advised on keeping the toes in a plantarflexed position with a digital splint being dispensed at this time. Continue elevation and immobilization and reappoint in 2 weeks for pin removal or earlier if any issues should occur

## 2014-07-07 ENCOUNTER — Ambulatory Visit (INDEPENDENT_AMBULATORY_CARE_PROVIDER_SITE_OTHER): Payer: Managed Care, Other (non HMO)

## 2014-07-07 ENCOUNTER — Ambulatory Visit (INDEPENDENT_AMBULATORY_CARE_PROVIDER_SITE_OTHER): Payer: Managed Care, Other (non HMO) | Admitting: Podiatry

## 2014-07-07 DIAGNOSIS — Z9889 Other specified postprocedural states: Secondary | ICD-10-CM | POA: Diagnosis not present

## 2014-07-07 DIAGNOSIS — M2040 Other hammer toe(s) (acquired), unspecified foot: Secondary | ICD-10-CM

## 2014-07-08 NOTE — Progress Notes (Signed)
Subjective:     Patient ID: Diana Craig, female   DOB: [birth year], 39 y.o.   MRN: [id number]  HPI patient states I'm doing well with my second toe on both feet stating that the pins are in place and I'm not having current discomfort   Review of Systems     Objective:   Physical Exam Neurovascular status intact with digits and good alignment with pins in place second toe bilateral    Assessment:     Doing well post fusion digits 2 bilateral with bunionectomy left    Plan:     Pins removed sterile dressings applied dispensed above ankle brace is to both control edema and to lower the second toe with braces being given for the second toe with instructions on physical therapy. Instructed on continued elevation gradual return soft shoe gear over the next 2 weeks and reappoint 4 weeks to recheck and reviewed x-rays today

## 2014-08-05 ENCOUNTER — Ambulatory Visit: Payer: Managed Care, Other (non HMO)

## 2014-08-08 ENCOUNTER — Encounter: Payer: Managed Care, Other (non HMO) | Admitting: Podiatry

## 2014-11-05 NOTE — Progress Notes (Signed)
This encounter was created in error - please disregard.

## 2017-11-14 DIAGNOSIS — Z6836 Body mass index (BMI) 36.0-36.9, adult: Secondary | ICD-10-CM

## 2017-11-14 DIAGNOSIS — E559 Vitamin D deficiency, unspecified: Secondary | ICD-10-CM | POA: Diagnosis not present

## 2017-11-14 DIAGNOSIS — E669 Obesity, unspecified: Secondary | ICD-10-CM

## 2018-03-21 ENCOUNTER — Ambulatory Visit: Payer: PRIVATE HEALTH INSURANCE | Admitting: Internal Medicine

## 2018-04-06 ENCOUNTER — Other Ambulatory Visit: Payer: Self-pay | Admitting: Internal Medicine

## 2018-07-11 ENCOUNTER — Ambulatory Visit: Payer: PRIVATE HEALTH INSURANCE | Admitting: Internal Medicine

## 2018-07-11 ENCOUNTER — Other Ambulatory Visit: Payer: Self-pay

## 2018-07-11 ENCOUNTER — Encounter: Payer: Self-pay | Admitting: Internal Medicine

## 2018-07-11 VITALS — BP 114/70 | HR 76 | Temp 98.2°F | Ht 62.8 in | Wt 202.0 lb

## 2018-07-11 DIAGNOSIS — M25551 Pain in right hip: Secondary | ICD-10-CM | POA: Diagnosis not present

## 2018-07-11 DIAGNOSIS — Z0000 Encounter for general adult medical examination without abnormal findings: Secondary | ICD-10-CM

## 2018-07-11 LAB — POCT URINALYSIS DIPSTICK
Bilirubin, UA: NEGATIVE
Blood, UA: NEGATIVE
Glucose, UA: NEGATIVE
Ketones, UA: NEGATIVE
Leukocytes, UA: NEGATIVE
Nitrite, UA: NEGATIVE
Protein, UA: NEGATIVE
Spec Grav, UA: 1.015 (ref 1.010–1.025)
Urobilinogen, UA: 0.2 E.U./dL
pH, UA: 8.5 — AB (ref 5.0–8.0)

## 2018-07-11 NOTE — Progress Notes (Addendum)
Subjective:     Patient ID: Diana Craig , female    DOB: 11/13/1975 , 43 y.o.   MRN: [id number]   Chief Complaint  Patient presents with  . Annual Exam    HPI  She is here today for a full physical examination. She is followed by Dr. Garwin Brothers for her GYN care. Her last pap smear was March 2020. She has no specific concerns or complaints at this time.     Past Medical History:  Diagnosis Date  . Anemia   . Anxiety   . Headache(784.0)    menstrual cycle related     Family History  Problem Relation Age of Onset  . COPD Mother   . Hypertension Mother   . Other Father        health status unknown     Current Outpatient Medications:  .  ibuprofen (ADVIL,MOTRIN) 800 MG tablet, Take 1 tablet (800 mg total) by mouth every 6 (six) hours as needed., Disp: 30 tablet, Rfl: 3 .  Multiple Vitamins-Minerals (MULTIVITAMIN ADULT PO), Take by mouth., Disp: , Rfl:    No Known Allergies    The patient states she uses none for birth control. Last LMP was Patient's last menstrual period was 07/03/2018.. Negative for Dysmenorrhea Negative for: breast discharge, breast lump(s), breast pain and breast self exam. Associated symptoms include abnormal vaginal bleeding. Pertinent negatives include abnormal bleeding (hematology), anxiety, decreased libido, depression, difficulty falling sleep, dyspareunia, history of infertility, nocturia, sexual dysfunction, sleep disturbances, urinary incontinence, urinary urgency, vaginal discharge and vaginal itching. Diet regular.The patient states her exercise level is  moderate.  . The patient's tobacco use is:  Social History   Tobacco Use  Smoking Status Never Smoker  Smokeless Tobacco Never Used  . She has been exposed to passive smoke. The patient's alcohol use is:  Social History   Substance and Sexual Activity  Alcohol Use Yes   Comment: social  . Additional information: Last pap 05/09/2018 next one scheduled for April 2021.   Review of  Systems  Constitutional: Negative.   HENT: Negative.   Eyes: Negative.   Respiratory: Negative.   Cardiovascular: Negative.   Gastrointestinal: Negative.   Endocrine: Negative.   Genitourinary: Negative.   Skin: Negative.   Allergic/Immunologic: Negative.   Neurological: Negative.   Hematological: Negative.   Psychiatric/Behavioral: Negative.      Today's Vitals   07/11/18 0857  BP: 114/70  Pulse: 76  Temp: 98.2 F (36.8 C)  TempSrc: Oral  Weight: 202 lb (91.6 kg)  Height: 5' 2.8" (1.595 m)   Body mass index is 36.01 kg/m.   Objective:  Physical Exam Vitals signs and nursing note reviewed.  Constitutional:      Appearance: Normal appearance.  HENT:     Head: Normocephalic and atraumatic.     Right Ear: Tympanic membrane, ear canal and external ear normal.     Left Ear: Tympanic membrane, ear canal and external ear normal.     Nose: Nose normal.     Mouth/Throat:     Mouth: Mucous membranes are moist.     Pharynx: Oropharynx is clear.  Eyes:     Extraocular Movements: Extraocular movements intact.     Conjunctiva/sclera: Conjunctivae normal.     Pupils: Pupils are equal, round, and reactive to light.  Neck:     Musculoskeletal: Normal range of motion and neck supple.  Cardiovascular:     Rate and Rhythm: Normal rate and regular rhythm.     Pulses:  Normal pulses.     Heart sounds: Normal heart sounds.  Pulmonary:     Effort: Pulmonary effort is normal.     Breath sounds: Normal breath sounds.  Chest:     Breasts:        Right: Normal. No swelling, inverted nipple, mass or nipple discharge.        Left: Normal. No swelling, bleeding, inverted nipple, mass or nipple discharge.  Abdominal:     General: Abdomen is flat. Bowel sounds are normal.     Palpations: Abdomen is soft.  Genitourinary:    Comments: deferred Musculoskeletal: Normal range of motion.  Skin:    General: Skin is warm and dry.  Neurological:     General: No focal deficit present.      Mental Status: She is alert and oriented to person, place, and time.  Psychiatric:        Mood and Affect: Mood normal.        Behavior: Behavior normal.         Assessment And Plan:     1. Routine general medical examination at health care facility  A full exam was performed. Importance of monthly self breast exams was discussed with the patient. PATIENT HAS BEEN ADVISED TO GET 30-45 MINUTES REGULAR EXERCISE NO LESS THAN FOUR TO FIVE DAYS PER WEEK - BOTH WEIGHTBEARING EXERCISES AND AEROBIC ARE RECOMMENDED.  SHE IS ADVISED TO FOLLOW A HEALTHY DIET WITH AT LEAST SIX FRUITS/VEGGIES PER DAY, DECREASE INTAKE OF RED MEAT, AND TO INCREASE FISH INTAKE TO TWO DAYS PER WEEK.  MEATS/FISH SHOULD NOT BE FRIED, BAKED OR BROILED IS PREFERABLE.  I SUGGEST WEARING SPF 50 SUNSCREEN ON EXPOSED PARTS AND ESPECIALLY WHEN IN THE DIRECT SUNLIGHT FOR AN EXTENDED PERIOD OF TIME.  PLEASE AVOID FAST FOOD RESTAURANTS AND INCREASE YOUR WATER INTAKE.  - CMP14+EGFR - CBC - Lipid panel - Hemoglobin A1c - Vitamin D (25 hydroxy)        Maximino Greenland, MD    THE PATIENT IS ENCOURAGED TO PRACTICE SOCIAL DISTANCING DUE TO THE COVID-19 PANDEMIC.

## 2018-07-11 NOTE — Patient Instructions (Signed)

## 2018-07-12 ENCOUNTER — Other Ambulatory Visit: Payer: Self-pay | Admitting: Internal Medicine

## 2018-07-13 LAB — LIPID PANEL
Chol/HDL Ratio: 2.9 ratio (ref 0.0–4.4)
Cholesterol, Total: 196 mg/dL (ref 100–199)
HDL: 68 mg/dL (ref >39)
LDL Calculated: 118 mg/dL — ABNORMAL HIGH (ref 0–99)
Triglycerides: 51 mg/dL (ref 0–149)
VLDL Cholesterol Cal: 10 mg/dL (ref 5–40)

## 2018-07-13 LAB — CMP14+EGFR
ALT: 15 IU/L (ref 0–32)
AST: 19 IU/L (ref 0–40)
Albumin/Globulin Ratio: 1.6 (ref 1.2–2.2)
Albumin: 4.5 g/dL (ref 3.8–4.8)
Alkaline Phosphatase: 75 IU/L (ref 39–117)
BUN/Creatinine Ratio: 14 (ref 9–23)
BUN: 11 mg/dL (ref 6–24)
Bilirubin Total: <0.2 mg/dL (ref 0.0–1.2)
CO2: 24 mmol/L (ref 20–29)
Calcium: 9.3 mg/dL (ref 8.7–10.2)
Chloride: 101 mmol/L (ref 96–106)
Creatinine, Ser: 0.76 mg/dL (ref 0.57–1.00)
GFR calc Af Amer: 112 mL/min/{1.73_m2} (ref >59)
GFR calc non Af Amer: 97 mL/min/{1.73_m2} (ref >59)
Globulin, Total: 2.8 g/dL (ref 1.5–4.5)
Glucose: 79 mg/dL (ref 65–99)
Potassium: 4.3 mmol/L (ref 3.5–5.2)
Sodium: 139 mmol/L (ref 134–144)
Total Protein: 7.3 g/dL (ref 6.0–8.5)

## 2018-07-13 LAB — CBC
Hematocrit: 37.9 % (ref 34.0–46.6)
Hemoglobin: 12.6 g/dL (ref 11.1–15.9)
MCH: 27.6 pg (ref 26.6–33.0)
MCHC: 33.2 g/dL (ref 31.5–35.7)
MCV: 83 fL (ref 79–97)
Platelets: 364 10*3/uL (ref 150–450)
RBC: 4.56 x10E6/uL (ref 3.77–5.28)
RDW: 15.3 % (ref 11.7–15.4)
WBC: 4.3 10*3/uL (ref 3.4–10.8)

## 2018-07-13 LAB — VITAMIN D 25 HYDROXY (VIT D DEFICIENCY, FRACTURES): Vit D, 25-Hydroxy: 31.6 ng/mL (ref 30.0–100.0)

## 2018-07-13 LAB — HEMOGLOBIN A1C
Est. average glucose Bld gHb Est-mCnc: 111 mg/dL
Hgb A1c MFr Bld: 5.5 % (ref 4.8–5.6)

## 2018-09-11 ENCOUNTER — Other Ambulatory Visit: Payer: Self-pay | Admitting: Internal Medicine

## 2018-09-11 MED ORDER — DAYVIGO 5 MG PO TABS: 5.0000 mg | ORAL_TABLET | Freq: Every evening | ORAL | 0 refills | Status: DC | PRN

## 2018-09-22 ENCOUNTER — Other Ambulatory Visit: Payer: Self-pay | Admitting: Internal Medicine

## 2018-09-22 MED ORDER — DAYVIGO 5 MG PO TABS: 5.0000 mg | ORAL_TABLET | Freq: Every evening | ORAL | 2 refills | Status: DC | PRN

## 2019-01-01 ENCOUNTER — Other Ambulatory Visit: Payer: Self-pay | Admitting: Internal Medicine

## 2019-01-02 NOTE — Telephone Encounter (Signed)
Dayvigo refill 

## 2019-04-06 ENCOUNTER — Other Ambulatory Visit: Payer: Self-pay | Admitting: Internal Medicine

## 2019-04-08 ENCOUNTER — Other Ambulatory Visit: Payer: Self-pay | Admitting: Internal Medicine

## 2019-04-08 MED ORDER — DAYVIGO 5 MG PO TABS: 5.0000 mg | ORAL_TABLET | Freq: Every evening | ORAL | 2 refills | Status: DC | PRN

## 2019-04-09 ENCOUNTER — Other Ambulatory Visit: Payer: Self-pay | Admitting: Internal Medicine

## 2019-04-09 MED ORDER — DAYVIGO 5 MG PO TABS: 5.0000 mg | ORAL_TABLET | Freq: Every evening | ORAL | 2 refills | Status: DC | PRN

## 2019-04-29 ENCOUNTER — Telehealth: Payer: Self-pay

## 2019-04-29 NOTE — Telephone Encounter (Signed)
LVM and Mychart message for pt to call the office to reschedule May appt. Appt is scheduled on a Friday needs to be changed

## 2019-07-12 ENCOUNTER — Encounter: Payer: PRIVATE HEALTH INSURANCE | Admitting: Internal Medicine

## 2019-07-21 ENCOUNTER — Other Ambulatory Visit: Payer: Self-pay | Admitting: Internal Medicine

## 2019-07-22 NOTE — Telephone Encounter (Signed)
Last 07/11/18 Next 08/01/19  Dayvigo refill

## 2019-08-01 ENCOUNTER — Ambulatory Visit: Payer: PRIVATE HEALTH INSURANCE | Admitting: Internal Medicine

## 2019-08-01 ENCOUNTER — Other Ambulatory Visit: Payer: Self-pay

## 2019-08-01 ENCOUNTER — Encounter: Payer: Self-pay | Admitting: Internal Medicine

## 2019-08-01 VITALS — BP 110/74 | HR 65 | Temp 98.6°F | Ht 63.8 in | Wt 224.0 lb

## 2019-08-01 DIAGNOSIS — Z0000 Encounter for general adult medical examination without abnormal findings: Secondary | ICD-10-CM | POA: Diagnosis not present

## 2019-08-01 DIAGNOSIS — Z6838 Body mass index (BMI) 38.0-38.9, adult: Secondary | ICD-10-CM | POA: Diagnosis not present

## 2019-08-01 DIAGNOSIS — E6609 Other obesity due to excess calories: Secondary | ICD-10-CM

## 2019-08-01 NOTE — Progress Notes (Signed)
This visit occurred during the SARS-CoV-2 public health emergency.  Safety protocols were in place, including screening questions prior to the visit, additional usage of staff PPE, and extensive cleaning of exam room while observing appropriate contact time as indicated for disinfecting solutions.  Subjective:     Patient ID: Diana Craig , female    DOB: 12-21-1975 , 44 y.o.   MRN: [id number]   Chief Complaint  Patient presents with  . Annual Exam    HPI  She is here today for a full physical exam. She has her GYN care performed by Dr. Garwin Brothers. She is currently undergoing infertility workup under the great care of Dr. Kerin Perna.     Past Medical History:  Diagnosis Date  . Anemia   . Anxiety   . Headache(784.0)    menstrual cycle related     Family History  Problem Relation Age of Onset  . COPD Mother   . Hypertension Mother   . Other Father        health status unknown     Current Outpatient Medications:  .  estradiol (ESTRACE) 2 MG tablet, Take 2 mg by mouth 2 (two) times daily., Disp: , Rfl:  .  ibuprofen (ADVIL,MOTRIN) 800 MG tablet, Take 1 tablet (800 mg total) by mouth every 6 (six) hours as needed., Disp: 30 tablet, Rfl: 3 .  Multiple Vitamins-Minerals (MULTIVITAMIN ADULT PO), Take by mouth., Disp: , Rfl:  .  PROGESTERONE IM, Inject 0.1 mLs into the muscle daily., Disp: , Rfl:  .  DAYVIGO 5 MG TABS, TAKE 1 TABLET BY MOUTH AT BEDTIME AS NEEDED (Patient not taking: Reported on 08/01/2019), Disp: 30 tablet, Rfl: 2   No Known Allergies    The patient states she uses none for birth control. Last LMP was Patient's last menstrual period was 07/12/2019.Marland Kitchen Positive for Menorrhagia Negative for: breast discharge, breast lump(s), breast pain and breast self exam. Associated symptoms include abnormal vaginal bleeding. Pertinent negatives include abnormal bleeding (hematology), anxiety, decreased libido, depression, difficulty falling sleep, dyspareunia, history of  infertility, nocturia, sexual dysfunction, sleep disturbances, urinary incontinence, urinary urgency, vaginal discharge and vaginal itching. Diet regular.The patient states her exercise level is  intermittent.   . The patient's tobacco use is:  Social History   Tobacco Use  Smoking Status Never Smoker  Smokeless Tobacco Never Used  . She has been exposed to passive smoke. The patient's alcohol use is:  Social History   Substance and Sexual Activity  Alcohol Use Yes   Comment: social    Review of Systems  Constitutional: Negative.   HENT: Negative.   Eyes: Negative.   Respiratory: Negative.   Cardiovascular: Negative.   Endocrine: Negative.   Genitourinary: Negative.   Musculoskeletal: Negative.   Skin: Negative.   Allergic/Immunologic: Negative.   Neurological: Negative.   Hematological: Negative.   Psychiatric/Behavioral: Negative.      Today's Vitals   08/01/19 1118  BP: 110/74  Pulse: 65  Temp: 98.6 F (37 C)  TempSrc: Oral  Weight: 224 lb (101.6 kg)  Height: 5' 3.8" (1.621 m)   Body mass index is 38.69 kg/m.   Wt Readings from Last 3 Encounters:  08/01/19 224 lb (101.6 kg)  07/11/18 202 lb (91.6 kg)  02/14/13 174 lb (78.9 kg)     Objective:  Physical Exam Vitals and nursing note reviewed.  Constitutional:      Appearance: Normal appearance.  HENT:     Head: Normocephalic and atraumatic.     Right  Ear: Tympanic membrane, ear canal and external ear normal.     Left Ear: Tympanic membrane, ear canal and external ear normal.     Nose: Nose normal.     Mouth/Throat:     Mouth: Mucous membranes are moist.     Pharynx: Oropharynx is clear.  Eyes:     Extraocular Movements: Extraocular movements intact.     Conjunctiva/sclera: Conjunctivae normal.     Pupils: Pupils are equal, round, and reactive to light.  Cardiovascular:     Rate and Rhythm: Normal rate and regular rhythm.     Pulses: Normal pulses.     Heart sounds: Normal heart sounds.   Pulmonary:     Effort: Pulmonary effort is normal.     Breath sounds: Normal breath sounds.  Chest:     Breasts: Tanner Score is 5.        Right: Inverted nipple present.        Left: Normal.  Abdominal:     General: Bowel sounds are normal.     Palpations: Abdomen is soft.     Comments: Rounded, soft  Genitourinary:    Comments: deferred Musculoskeletal:        General: Normal range of motion.     Cervical back: Normal range of motion and neck supple.  Skin:    General: Skin is warm and dry.  Neurological:     General: No focal deficit present.     Mental Status: She is alert and oriented to person, place, and time.  Psychiatric:        Mood and Affect: Mood normal.        Behavior: Behavior normal.         Assessment And Plan:     1. Routine general medical examination at health care facility  A full exam was performed. Importance of monthly self breast exams was discussed with the patient. PATIENT IS ADVISED TO GET 30-45 MINUTES REGULAR EXERCISE NO LESS THAN FOUR TO FIVE DAYS PER WEEK - BOTH WEIGHTBEARING EXERCISES AND AEROBIC ARE RECOMMENDED.  SHE IS ADVISED TO FOLLOW A HEALTHY DIET WITH AT LEAST SIX FRUITS/VEGGIES PER DAY, DECREASE INTAKE OF RED MEAT, AND TO INCREASE FISH INTAKE TO TWO DAYS PER WEEK.  MEATS/FISH SHOULD NOT BE FRIED, BAKED OR BROILED IS PREFERABLE.  I SUGGEST WEARING SPF 50 SUNSCREEN ON EXPOSED PARTS AND ESPECIALLY WHEN IN THE DIRECT SUNLIGHT FOR AN EXTENDED PERIOD OF TIME.  PLEASE AVOID FAST FOOD RESTAURANTS AND INCREASE YOUR WATER INTAKE.  - CMP14+EGFR - CBC - Lipid panel - Hemoglobin A1c  2. Class 2 obesity due to excess calories without serious comorbidity with body mass index (BMI) of 38.0 to 38.9 in adult  She has gained about 18 pounds since last year, likely related to pandemic and fertility meds. She is encouraged to strive for BMI less than 30 to decrease cardiac risk and to incorporate more exercise into her daily routine.    Maximino Greenland, MD    THE PATIENT IS ENCOURAGED TO PRACTICE SOCIAL DISTANCING DUE TO THE COVID-19 PANDEMIC.

## 2019-08-01 NOTE — Patient Instructions (Signed)
Health Maintenance, Female Adopting a healthy lifestyle and getting preventive care are important in promoting health and wellness. Ask your health care provider about:  The right schedule for you to have regular tests and exams.  Things you can do on your own to prevent diseases and keep yourself healthy. What should I know about diet, weight, and exercise? Eat a healthy diet   Eat a diet that includes plenty of vegetables, fruits, low-fat dairy products, and lean protein.  Do not eat a lot of foods that are high in solid fats, added sugars, or sodium. Maintain a healthy weight Body mass index (BMI) is used to identify weight problems. It estimates body fat based on height and weight. Your health care provider can help determine your BMI and help you achieve or maintain a healthy weight. Get regular exercise Get regular exercise. This is one of the most important things you can do for your health. Most adults should:  Exercise for at least 150 minutes each week. The exercise should increase your heart rate and make you sweat (moderate-intensity exercise).  Do strengthening exercises at least twice a week. This is in addition to the moderate-intensity exercise.  Spend less time sitting. Even light physical activity can be beneficial. Watch cholesterol and blood lipids Have your blood tested for lipids and cholesterol at 44 years of age, then have this test every 5 years. Have your cholesterol levels checked more often if:  Your lipid or cholesterol levels are high.  You are older than 44 years of age.  You are at high risk for heart disease. What should I know about cancer screening? Depending on your health history and family history, you may need to have cancer screening at various ages. This may include screening for:  Breast cancer.  Cervical cancer.  Colorectal cancer.  Skin cancer.  Lung cancer. What should I know about heart disease, diabetes, and high blood  pressure? Blood pressure and heart disease  High blood pressure causes heart disease and increases the risk of stroke. This is more likely to develop in people who have high blood pressure readings, are of African descent, or are overweight.  Have your blood pressure checked: ? Every 3-5 years if you are 18-39 years of age. ? Every year if you are 40 years old or older. Diabetes Have regular diabetes screenings. This checks your fasting blood sugar level. Have the screening done:  Once every three years after age 40 if you are at a normal weight and have a low risk for diabetes.  More often and at a younger age if you are overweight or have a high risk for diabetes. What should I know about preventing infection? Hepatitis B If you have a higher risk for hepatitis B, you should be screened for this virus. Talk with your health care provider to find out if you are at risk for hepatitis B infection. Hepatitis C Testing is recommended for:  Everyone born from 1945 through 1965.  Anyone with known risk factors for hepatitis C. Sexually transmitted infections (STIs)  Get screened for STIs, including gonorrhea and chlamydia, if: ? You are sexually active and are younger than 44 years of age. ? You are older than 44 years of age and your health care provider tells you that you are at risk for this type of infection. ? Your sexual activity has changed since you were last screened, and you are at increased risk for chlamydia or gonorrhea. Ask your health care provider if   you are at risk.  Ask your health care provider about whether you are at high risk for HIV. Your health care provider may recommend a prescription medicine to help prevent HIV infection. If you choose to take medicine to prevent HIV, you should first get tested for HIV. You should then be tested every 3 months for as long as you are taking the medicine. Pregnancy  If you are about to stop having your period (premenopausal) and  you may become pregnant, seek counseling before you get pregnant.  Take 400 to 800 micrograms (mcg) of folic acid every day if you become pregnant.  Ask for birth control (contraception) if you want to prevent pregnancy. Osteoporosis and menopause Osteoporosis is a disease in which the bones lose minerals and strength with aging. This can result in bone fractures. If you are 65 years old or older, or if you are at risk for osteoporosis and fractures, ask your health care provider if you should:  Be screened for bone loss.  Take a calcium or vitamin D supplement to lower your risk of fractures.  Be given hormone replacement therapy (HRT) to treat symptoms of menopause. Follow these instructions at home: Lifestyle  Do not use any products that contain nicotine or tobacco, such as cigarettes, e-cigarettes, and chewing tobacco. If you need help quitting, ask your health care provider.  Do not use street drugs.  Do not share needles.  Ask your health care provider for help if you need support or information about quitting drugs. Alcohol use  Do not drink alcohol if: ? Your health care provider tells you not to drink. ? You are pregnant, may be pregnant, or are planning to become pregnant.  If you drink alcohol: ? Limit how much you use to 0-1 drink a day. ? Limit intake if you are breastfeeding.  Be aware of how much alcohol is in your drink. In the [place] one drink equals one 12 oz bottle of beer (355 mL), one 5 oz glass of wine (148 mL), or one 1 oz glass of hard liquor (44 mL). General instructions  Schedule regular health, dental, and eye exams.  Stay current with your vaccines.  Tell your health care provider if: ? You often feel depressed. ? You have ever been abused or do not feel safe at home. Summary  Adopting a healthy lifestyle and getting preventive care are important in promoting health and wellness.  Follow your health care provider's instructions about healthy  diet, exercising, and getting tested or screened for diseases.  Follow your health care provider's instructions on monitoring your cholesterol and blood pressure. This information is not intended to replace advice given to you by your health care provider. Make sure you discuss any questions you have with your health care provider. Document Revised: 02/14/2018 Document Reviewed: 02/14/2018 Elsevier Patient Education  2020 [employer] Inc[employer]  

## 2019-08-02 LAB — CBC
Hematocrit: 39.9 % (ref 34.0–46.6)
Hemoglobin: 12.7 g/dL (ref 11.1–15.9)
MCH: 27.7 pg (ref 26.6–33.0)
MCHC: 31.8 g/dL (ref 31.5–35.7)
MCV: 87 fL (ref 79–97)
Platelets: 353 10*3/uL (ref 150–450)
RBC: 4.58 x10E6/uL (ref 3.77–5.28)
RDW: 13.6 % (ref 11.7–15.4)
WBC: 6.6 10*3/uL (ref 3.4–10.8)

## 2019-08-02 LAB — CMP14+EGFR
ALT: 48 IU/L — ABNORMAL HIGH (ref 0–32)
AST: 24 IU/L (ref 0–40)
Albumin/Globulin Ratio: 1.3 (ref 1.2–2.2)
Albumin: 3.9 g/dL (ref 3.8–4.8)
Alkaline Phosphatase: 70 IU/L (ref 48–121)
BUN/Creatinine Ratio: 14 (ref 9–23)
BUN: 11 mg/dL (ref 6–24)
Bilirubin Total: <0.2 mg/dL (ref 0.0–1.2)
CO2: 21 mmol/L (ref 20–29)
Calcium: 9.1 mg/dL (ref 8.7–10.2)
Chloride: 104 mmol/L (ref 96–106)
Creatinine, Ser: 0.81 mg/dL (ref 0.57–1.00)
GFR calc Af Amer: 103 mL/min/{1.73_m2} (ref >59)
GFR calc non Af Amer: 89 mL/min/{1.73_m2} (ref >59)
Globulin, Total: 2.9 g/dL (ref 1.5–4.5)
Glucose: 77 mg/dL (ref 65–99)
Potassium: 3.8 mmol/L (ref 3.5–5.2)
Sodium: 140 mmol/L (ref 134–144)
Total Protein: 6.8 g/dL (ref 6.0–8.5)

## 2019-08-02 LAB — LIPID PANEL
Chol/HDL Ratio: 2.8 ratio (ref 0.0–4.4)
Cholesterol, Total: 153 mg/dL (ref 100–199)
HDL: 54 mg/dL (ref >39)
LDL Chol Calc (NIH): 84 mg/dL (ref 0–99)
Triglycerides: 80 mg/dL (ref 0–149)
VLDL Cholesterol Cal: 15 mg/dL (ref 5–40)

## 2019-08-02 LAB — HEMOGLOBIN A1C
Est. average glucose Bld gHb Est-mCnc: 114 mg/dL
Hgb A1c MFr Bld: 5.6 % (ref 4.8–5.6)

## 2019-09-11 ENCOUNTER — Other Ambulatory Visit: Payer: Self-pay | Admitting: Obstetrics and Gynecology

## 2019-09-11 DIAGNOSIS — R928 Other abnormal and inconclusive findings on diagnostic imaging of breast: Secondary | ICD-10-CM

## 2019-09-17 ENCOUNTER — Encounter: Payer: Self-pay | Admitting: Internal Medicine

## 2019-09-24 ENCOUNTER — Ambulatory Visit
Admission: RE | Admit: 2019-09-24 | Discharge: 2019-09-24 | Disposition: A | Discharge status: Home / Self Care | Payer: PRIVATE HEALTH INSURANCE | Source: Ambulatory Visit | Attending: Obstetrics and Gynecology | Admitting: Obstetrics and Gynecology

## 2019-09-24 ENCOUNTER — Ambulatory Visit
Admission: RE | Admit: 2019-09-24 | Discharge: 2019-09-24 | Disposition: A | Discharge status: Home / Self Care | Payer: Managed Care, Other (non HMO) | Source: Ambulatory Visit | Attending: Obstetrics and Gynecology | Admitting: Obstetrics and Gynecology

## 2019-09-24 ENCOUNTER — Other Ambulatory Visit: Payer: Self-pay

## 2019-09-24 DIAGNOSIS — R928 Other abnormal and inconclusive findings on diagnostic imaging of breast: Secondary | ICD-10-CM

## 2019-09-30 ENCOUNTER — Other Ambulatory Visit: Payer: Self-pay

## 2019-09-30 ENCOUNTER — Ambulatory Visit: Payer: PRIVATE HEALTH INSURANCE | Admitting: Internal Medicine

## 2019-09-30 VITALS — BP 118/78 | HR 98 | Temp 98.5°F | Ht 63.8 in | Wt 224.4 lb

## 2019-09-30 DIAGNOSIS — E559 Vitamin D deficiency, unspecified: Secondary | ICD-10-CM | POA: Diagnosis not present

## 2019-09-30 DIAGNOSIS — Z6838 Body mass index (BMI) 38.0-38.9, adult: Secondary | ICD-10-CM | POA: Diagnosis not present

## 2019-09-30 DIAGNOSIS — E6609 Other obesity due to excess calories: Secondary | ICD-10-CM | POA: Diagnosis not present

## 2019-09-30 NOTE — Patient Instructions (Signed)

## 2019-09-30 NOTE — Progress Notes (Signed)
I,Yamilka [employer] as a Education administrator for Maximino Greenland, MD.,have documented all relevant documentation on the behalf of Maximino Greenland, MD,as directed by  Maximino Greenland, MD while in the presence of Maximino Greenland, MD.  This visit occurred during the SARS-CoV-2 public health emergency.  Safety protocols were in place, including screening questions prior to the visit, additional usage of staff PPE, and extensive cleaning of exam room while observing appropriate contact time as indicated for disinfecting solutions.  Subjective:     Patient ID: Diana Craig , female    DOB: [birth year] , 44 y.o.   MRN: [id number]   Chief Complaint  Patient presents with  . Weight Check    HPI  She presents for f/u obesity. She is interested in starting [place].  She denies family h/o thyroid cancer. She denies personal h/o thyroid cancer. She has taken Saxenda in the past without any adverse effects.     Past Medical History:  Diagnosis Date  . Anemia   . Anxiety   . Headache(784.0)    menstrual cycle related     Family History  Problem Relation Age of Onset  . COPD Mother   . Hypertension Mother   . Other Father        health status unknown     Current Outpatient Medications:  .  DAYVIGO 5 MG TABS, TAKE 1 TABLET BY MOUTH AT BEDTIME AS NEEDED, Disp: 30 tablet, Rfl: 2 .  ibuprofen (ADVIL,MOTRIN) 800 MG tablet, Take 1 tablet (800 mg total) by mouth every 6 (six) hours as needed., Disp: 30 tablet, Rfl: 3 .  Multiple Vitamins-Minerals (MULTIVITAMIN ADULT PO), Take by mouth., Disp: , Rfl:    No Known Allergies   Review of Systems  Constitutional: Negative.   Respiratory: Negative.   Cardiovascular: Negative.   Gastrointestinal: Negative.   Neurological: Negative.   Psychiatric/Behavioral: Negative.      Today's Vitals   09/30/19 1623  BP: 118/78  Pulse: 98  Temp: 98.5 F (36.9 C)  TempSrc: Oral  Weight: (!) 224 lb 6.4 oz (101.8 kg)  Height: 5' 3.8" (1.621 m)   PainSc: 0-No pain   Body mass index is 38.76 kg/m.   Objective:  Physical Exam Vitals and nursing note reviewed.  Constitutional:      Appearance: Normal appearance. She is obese.  HENT:     Head: Normocephalic and atraumatic.  Eyes:     Extraocular Movements: Extraocular movements intact.  Cardiovascular:     Rate and Rhythm: Normal rate and regular rhythm.     Heart sounds: Normal heart sounds.  Pulmonary:     Effort: Pulmonary effort is normal.     Breath sounds: Normal breath sounds.  Genitourinary:    Comments: deferred Musculoskeletal:     Cervical back: Normal range of motion and neck supple.  Skin:    General: Skin is warm.  Neurological:     General: No focal deficit present.     Mental Status: She is alert and oriented to person, place, and time.  Psychiatric:        Mood and Affect: Mood normal.        Behavior: Behavior normal.         Assessment And Plan:     1. Class 2 obesity due to excess calories without serious comorbidity with body mass index (BMI) of 38.0 to 38.9 in adult   We discussed the use of Wegovy to address obesity. She confirms there is no  personal/family h/o thyroid cancer. She was instructed on how to self-administer the medication. Possible side effects including nausea, constipation and diarrhea were discussed with the patient. Its association with medullary thyroid carcinoma was also discussed. She is reminded to stop eating when full. She will start with 0.25mg  x 2 weeks, then 0.5mg  x 4 weeks, then 1mg  x 4 weeks. All questions were answered to her satisfaction. She is in agreement with her treatment plan. She will rto in 4 weeks for re-evaluation.   2. Vitamin D deficiency disease  Advised to take 5000 IU vitamin D3 capsules once daily. She is able to get this over the counter.    Patient was given opportunity to ask questions. Patient verbalized understanding of the plan and was able to repeat key elements of the plan. All questions  were answered to their satisfaction.  Maximino Greenland, MD   I, Maximino Greenland, MD, have reviewed all documentation for this visit. The documentation on 10/01/19 for the exam, diagnosis, procedures, and orders are all accurate and complete.  THE PATIENT IS ENCOURAGED TO PRACTICE SOCIAL DISTANCING DUE TO THE COVID-19 PANDEMIC.

## 2019-10-01 ENCOUNTER — Encounter: Payer: Self-pay | Admitting: Internal Medicine

## 2019-10-28 ENCOUNTER — Other Ambulatory Visit: Payer: Self-pay | Admitting: Internal Medicine

## 2019-10-29 NOTE — Telephone Encounter (Signed)
Dayvigo refill 

## 2019-11-07 ENCOUNTER — Encounter: Payer: Self-pay | Admitting: Internal Medicine

## 2019-11-07 ENCOUNTER — Other Ambulatory Visit: Payer: Self-pay

## 2019-11-07 ENCOUNTER — Ambulatory Visit: Payer: PRIVATE HEALTH INSURANCE | Admitting: Internal Medicine

## 2019-11-07 VITALS — BP 130/64 | HR 67 | Temp 98.7°F | Ht 63.2 in | Wt 218.8 lb

## 2019-11-07 DIAGNOSIS — E6609 Other obesity due to excess calories: Secondary | ICD-10-CM

## 2019-11-07 DIAGNOSIS — R7989 Other specified abnormal findings of blood chemistry: Secondary | ICD-10-CM | POA: Diagnosis not present

## 2019-11-07 DIAGNOSIS — Z6838 Body mass index (BMI) 38.0-38.9, adult: Secondary | ICD-10-CM | POA: Diagnosis not present

## 2019-11-07 NOTE — Progress Notes (Signed)
I,Tianna Badgett,acting as a Education administrator for Maximino Greenland, MD.,have documented all relevant documentation on the behalf of Maximino Greenland, MD,as directed by  Maximino Greenland, MD while in the presence of Maximino Greenland, MD.  This visit occurred during the SARS-CoV-2 public health emergency.  Safety protocols were in place, including screening questions prior to the visit, additional usage of staff PPE, and extensive cleaning of exam room while observing appropriate contact time as indicated for disinfecting solutions.  Subjective:     Patient ID: Diana Craig , female    DOB: [birth year] , 44 y.o.   MRN: [id number]   Chief Complaint  Patient presents with  . Weight Check    HPI  She presents for f/u obesity. She has been taking Wegovy without any issues. She has not been able to find refill in local pharmacy.     Past Medical History:  Diagnosis Date  . Anemia   . Anxiety   . Headache(784.0)    menstrual cycle related     Family History  Problem Relation Age of Onset  . COPD Mother   . Hypertension Mother   . Other Father        health status unknown     Current Outpatient Medications:  .  DAYVIGO 5 MG TABS, TAKE 1 TABLET BY MOUTH EVERY DAY AT BEDTIME AS NEEDED, Disp: 30 tablet, Rfl: 2 .  ibuprofen (ADVIL,MOTRIN) 800 MG tablet, Take 1 tablet (800 mg total) by mouth every 6 (six) hours as needed., Disp: 30 tablet, Rfl: 3 .  Multiple Vitamins-Minerals (MULTIVITAMIN ADULT PO), Take by mouth., Disp: , Rfl:    No Known Allergies   Review of Systems  Constitutional: Negative.   Respiratory: Negative.   Cardiovascular: Negative.   Gastrointestinal: Negative.   Neurological: Negative.      Today's Vitals   11/07/19 1644  BP: 130/64  Pulse: 67  Temp: 98.7 F (37.1 C)  TempSrc: Oral  Weight: 218 lb 12.8 oz (99.2 kg)  Height: 5' 3.2" (1.605 m)   Body mass index is 38.51 kg/m.   Objective:  Physical Exam Vitals and nursing note reviewed.  Constitutional:       Appearance: Normal appearance. She is obese.  HENT:     Head: Normocephalic and atraumatic.  Cardiovascular:     Rate and Rhythm: Normal rate and regular rhythm.     Heart sounds: Normal heart sounds.  Pulmonary:     Effort: Pulmonary effort is normal.     Breath sounds: Normal breath sounds.  Skin:    General: Skin is warm.  Neurological:     General: No focal deficit present.     Mental Status: She is alert.  Psychiatric:        Mood and Affect: Mood normal.        Behavior: Behavior normal.         Assessment And Plan:     1. Class 2 obesity due to excess calories without serious comorbidity with body mass index (BMI) of 38.0 to 38.9 in adult She was congratulated on her 6 pound weight loss and encouraged to keep up the great work! She is encouraged to strive for BMI less than 30 to decrease cardiac risk. Advised to aim for at least 150 minutes of exercise per week. Wt Readings from Last 3 Encounters:  11/07/19 218 lb 12.8 oz (99.2 kg)  09/30/19 (!) 224 lb 6.4 oz (101.8 kg)  08/01/19 224 lb (101.6 kg)  2. Elevated LFTs  May labwork reviewed. ALT is slightly elevated, 48. I will recheck at her next visit.     Patient was given opportunity to ask questions. Patient verbalized understanding of the plan and was able to repeat key elements of the plan. All questions were answered to their satisfaction.  Maximino Greenland, MD   I, Maximino Greenland, MD, have reviewed all documentation for this visit. The documentation on 12/02/19 for the exam, diagnosis, procedures, and orders are all accurate and complete.  THE PATIENT IS ENCOURAGED TO PRACTICE SOCIAL DISTANCING DUE TO THE COVID-19 PANDEMIC.

## 2019-11-07 NOTE — Patient Instructions (Signed)

## 2019-12-24 ENCOUNTER — Other Ambulatory Visit: Payer: Self-pay

## 2019-12-24 ENCOUNTER — Encounter: Payer: Self-pay | Admitting: Internal Medicine

## 2019-12-24 MED ORDER — WEGOVY 1 MG/0.5ML ~~LOC~~ SOAJ: 1.0000 mg | SUBCUTANEOUS | 0 refills | Status: DC

## 2019-12-30 ENCOUNTER — Other Ambulatory Visit: Payer: Self-pay | Admitting: Internal Medicine

## 2019-12-30 ENCOUNTER — Other Ambulatory Visit: Payer: Self-pay

## 2019-12-30 MED ORDER — WEGOVY 1 MG/0.5ML ~~LOC~~ SOAJ: 1.0000 mg | SUBCUTANEOUS | 0 refills | Status: DC

## 2019-12-30 MED ORDER — WEGOVY 1.7 MG/0.75ML ~~LOC~~ SOAJ: 1.7000 mg | SUBCUTANEOUS | 0 refills | Status: DC

## 2019-12-31 MED FILL — WEGOVY 1 MG/0.5ML SOAJ: 1 | 28 days supply | Qty: 2 | Fill #0

## 2020-01-07 ENCOUNTER — Encounter: Payer: Self-pay | Admitting: Internal Medicine

## 2020-01-07 ENCOUNTER — Other Ambulatory Visit: Payer: Self-pay | Admitting: Internal Medicine

## 2020-01-07 ENCOUNTER — Other Ambulatory Visit: Payer: Self-pay

## 2020-01-07 ENCOUNTER — Ambulatory Visit: Payer: PRIVATE HEALTH INSURANCE | Admitting: Internal Medicine

## 2020-01-07 VITALS — BP 116/78 | HR 89 | Temp 98.1°F | Ht 63.2 in | Wt 212.2 lb

## 2020-01-07 DIAGNOSIS — Z6837 Body mass index (BMI) 37.0-37.9, adult: Secondary | ICD-10-CM | POA: Diagnosis not present

## 2020-01-07 DIAGNOSIS — E6609 Other obesity due to excess calories: Secondary | ICD-10-CM

## 2020-01-07 DIAGNOSIS — Z1159 Encounter for screening for other viral diseases: Secondary | ICD-10-CM | POA: Diagnosis not present

## 2020-01-07 DIAGNOSIS — E559 Vitamin D deficiency, unspecified: Secondary | ICD-10-CM | POA: Diagnosis not present

## 2020-01-07 MED ORDER — WEGOVY 1.7 MG/0.75ML ~~LOC~~ SOAJ: 1.7000 mg | SUBCUTANEOUS | 0 refills | Status: DC

## 2020-01-07 MED FILL — WEGOVY 1.7 MG/0.75ML SOAJ: 1.7 | 28 days supply | Qty: 3 | Fill #0

## 2020-01-07 NOTE — Progress Notes (Signed)
I,Katawbba Wiggins,acting as a Education administrator for Maximino Greenland, MD.,have documented all relevant documentation on the behalf of Maximino Greenland, MD,as directed by  Maximino Greenland, MD while in the presence of Maximino Greenland, MD.  This visit occurred during the SARS-CoV-2 public health emergency.  Safety protocols were in place, including screening questions prior to the visit, additional usage of staff PPE, and extensive cleaning of exam room while observing appropriate contact time as indicated for disinfecting solutions.  Subjective:     Patient ID: Diana Craig , female    DOB: 05-31-1975 , 44 y.o.   MRN: [id number]   Chief Complaint  Patient presents with  . Obesity    HPI  She presents for f/u obesity. She has been taking Wegovy without any issues. She has not been able to find refill in local pharmacy in [place]. She comes to [facility] to fill her rx.     Past Medical History:  Diagnosis Date  . Anemia   . Anxiety   . Headache(784.0)    menstrual cycle related     Family History  Problem Relation Age of Onset  . COPD Mother   . Hypertension Mother   . Other Father        health status unknown     Current Outpatient Medications:  .  DAYVIGO 5 MG TABS, TAKE 1 TABLET BY MOUTH EVERY DAY AT BEDTIME AS NEEDED, Disp: 30 tablet, Rfl: 2 .  ibuprofen (ADVIL,MOTRIN) 800 MG tablet, Take 1 tablet (800 mg total) by mouth every 6 (six) hours as needed., Disp: 30 tablet, Rfl: 3 .  Multiple Vitamins-Minerals (MULTIVITAMIN ADULT PO), Take by mouth., Disp: , Rfl:  .  Semaglutide-Weight Management (WEGOVY) 1.7 MG/0.75ML SOAJ, Inject 1.7 mg into the skin once a week., Disp: 3 mL, Rfl: 0   No Known Allergies   Review of Systems  Constitutional: Negative.   Respiratory: Negative.   Cardiovascular: Negative.   Gastrointestinal: Negative.   Neurological: Negative.      Today's Vitals   01/07/20 1621  BP: 116/78  Pulse: 89  Temp: 98.1 F (36.7 C)  TempSrc: Oral  Weight:  212 lb 3.2 oz (96.3 kg)  Height: 5' 3.2" (1.605 m)   Body mass index is 37.35 kg/m.  Wt Readings from Last 3 Encounters:  01/07/20 212 lb 3.2 oz (96.3 kg)  11/07/19 218 lb 12.8 oz (99.2 kg)  09/30/19 (!) 224 lb 6.4 oz (101.8 kg)   Objective:  Physical Exam Vitals and nursing note reviewed.  Constitutional:      Appearance: Normal appearance. She is obese.  HENT:     Head: Normocephalic and atraumatic.  Cardiovascular:     Rate and Rhythm: Normal rate and regular rhythm.     Heart sounds: Normal heart sounds.  Pulmonary:     Effort: Pulmonary effort is normal.     Breath sounds: Normal breath sounds.  Skin:    General: Skin is warm.  Neurological:     General: No focal deficit present.     Mental Status: She is alert.  Psychiatric:        Mood and Affect: Mood normal.        Behavior: Behavior normal.         Assessment And Plan:     1. Class 2 obesity due to excess calories without serious comorbidity with body mass index (BMI) of 37.0 to 37.9 in adult Comments: She has lost another 6 pounds since her last visit.  She was congratulated on her progress thus far and encouraged to keep up the great work! I will increase the dose of Wegovy 1.7mg  once weekly. She will progress to 2.4mg  once weekly after four weeks on previous dose. She will rto in six to eight weeks for re-evaluation.   2. Vitamin D deficiency Comments: I will check vitamin D level and supplement as needed.  - Vitamin D (25 hydroxy)  3. Encounter for HCV screening test for low risk patient Comments: I will check HCV antibody.  - Hepatitis C antibody    Patient was given opportunity to ask questions. Patient verbalized understanding of the plan and was able to repeat key elements of the plan. All questions were answered to their satisfaction.  Maximino Greenland, MD   I, Maximino Greenland, MD, have reviewed all documentation for this visit. The documentation on 01/12/20 for the exam, diagnosis, procedures,  and orders are all accurate and complete.  THE PATIENT IS ENCOURAGED TO PRACTICE SOCIAL DISTANCING DUE TO THE COVID-19 PANDEMIC.

## 2020-01-07 NOTE — Patient Instructions (Signed)

## 2020-01-08 LAB — VITAMIN D 25 HYDROXY (VIT D DEFICIENCY, FRACTURES): Vit D, 25-Hydroxy: 30.3 ng/mL (ref 30.0–100.0)

## 2020-01-08 LAB — HEPATITIS C ANTIBODY: Hep C Virus Ab: 0.2 s/co ratio (ref 0.0–0.9)

## 2020-02-02 ENCOUNTER — Other Ambulatory Visit: Payer: Self-pay | Admitting: Internal Medicine

## 2020-02-03 NOTE — Telephone Encounter (Signed)
Dayvigo

## 2020-02-10 ENCOUNTER — Other Ambulatory Visit: Payer: Self-pay

## 2020-02-10 MED ORDER — WEGOVY 1.7 MG/0.75ML ~~LOC~~ SOAJ: 1.7000 mg | SUBCUTANEOUS | 0 refills | Status: DC

## 2020-02-10 MED ORDER — WEGOVY 2.4 MG/0.75ML ~~LOC~~ SOAJ: 2.4000 mg | SUBCUTANEOUS | 0 refills | Status: DC

## 2020-02-18 ENCOUNTER — Other Ambulatory Visit: Payer: Self-pay | Admitting: Internal Medicine

## 2020-02-18 ENCOUNTER — Other Ambulatory Visit: Payer: Self-pay

## 2020-02-18 MED ORDER — WEGOVY 1.7 MG/0.75ML ~~LOC~~ SOAJ: 1.7000 mg | SUBCUTANEOUS | 0 refills | Status: DC

## 2020-02-18 MED ORDER — WEGOVY 2.4 MG/0.75ML ~~LOC~~ SOAJ: 2.4000 mg | SUBCUTANEOUS | 0 refills | Status: DC

## 2020-02-20 MED FILL — WEGOVY 2.4 MG/0.75ML SOAJ: 2.4 | 28 days supply | Qty: 3 | Fill #0

## 2020-03-10 ENCOUNTER — Encounter: Payer: Self-pay | Admitting: Internal Medicine

## 2020-03-10 ENCOUNTER — Ambulatory Visit: Payer: PRIVATE HEALTH INSURANCE | Admitting: Internal Medicine

## 2020-03-10 ENCOUNTER — Other Ambulatory Visit: Payer: Self-pay

## 2020-03-10 ENCOUNTER — Other Ambulatory Visit: Payer: Self-pay | Admitting: Internal Medicine

## 2020-03-10 VITALS — BP 120/70 | HR 66 | Temp 97.8°F | Ht 63.2 in | Wt 204.0 lb

## 2020-03-10 DIAGNOSIS — Z6835 Body mass index (BMI) 35.0-35.9, adult: Secondary | ICD-10-CM

## 2020-03-10 DIAGNOSIS — E6609 Other obesity due to excess calories: Secondary | ICD-10-CM | POA: Diagnosis not present

## 2020-03-10 MED ORDER — WEGOVY 1 MG/0.5ML ~~LOC~~ SOAJ: 1.0000 mg | SUBCUTANEOUS | 2 refills | Status: DC

## 2020-03-10 MED ORDER — WEGOVY 1.7 MG/0.75ML ~~LOC~~ SOAJ: 1.7000 mg | SUBCUTANEOUS | 1 refills | Status: DC

## 2020-03-10 MED FILL — WEGOVY 1.7 MG/0.75ML SOAJ: 1.7 | 28 days supply | Qty: 3 | Fill #0

## 2020-03-10 NOTE — Progress Notes (Signed)
I,Tianna Badgett,acting as a Neurosurgeon for Gwynneth Aliment, MD.,have documented all relevant documentation on the behalf of Gwynneth Aliment, MD,as directed by  Gwynneth Aliment, MD while in the presence of Gwynneth Aliment, MD.  This visit occurred during the SARS-CoV-2 public health emergency.  Safety protocols were in place, including screening questions prior to the visit, additional usage of staff PPE, and extensive cleaning of exam room while observing appropriate contact time as indicated for disinfecting solutions.  Subjective:     Patient ID: Diana Craig , female    DOB: 15-Jun-1975 , 45 y.o.   MRN: [id number]   Chief Complaint  Patient presents with  . Obesity    HPI  She presents for f/u obesity. She has been taking Wegovy without any issues. She is aware of possibly supply issues. She is willing to take lower dose if needed, she doesn't want to stop taking the meds. She has not had any side effects from the medication.     Past Medical History:  Diagnosis Date  . Anemia   . Anxiety   . Headache(784.0)    menstrual cycle related     Family History  Problem Relation Age of Onset  . COPD Mother   . Hypertension Mother   . Other Father        health status unknown     Current Outpatient Medications:  .  DAYVIGO 5 MG TABS, TAKE 1 TABLET BY MOUTH EVERY DAY AT BEDTIME AS NEEDED, Disp: 30 tablet, Rfl: 2 .  ibuprofen (ADVIL,MOTRIN) 800 MG tablet, Take 1 tablet (800 mg total) by mouth every 6 (six) hours as needed., Disp: 30 tablet, Rfl: 3 .  Multiple Vitamins-Minerals (MULTIVITAMIN ADULT PO), Take by mouth., Disp: , Rfl:  .  Semaglutide-Weight Management (WEGOVY) 1.7 MG/0.75ML SOAJ, Inject 1.7 mg into the skin once a week., Disp: 3 mL, Rfl: 1   No Known Allergies   Review of Systems  Constitutional: Negative.   Respiratory: Negative.   Cardiovascular: Negative.   Gastrointestinal: Negative.   Neurological: Negative.      Today's Vitals   03/10/20 1616  BP:  120/70  Pulse: 66  Temp: 97.8 F (36.6 C)  TempSrc: Oral  Weight: 204 lb (92.5 kg)  Height: 5' 3.2" (1.605 m)   Body mass index is 35.91 kg/m.  Wt Readings from Last 3 Encounters:  03/10/20 204 lb (92.5 kg)  01/07/20 212 lb 3.2 oz (96.3 kg)  11/07/19 218 lb 12.8 oz (99.2 kg)    Objective:  Physical Exam Vitals and nursing note reviewed.  Constitutional:      Appearance: Normal appearance. She is obese.  HENT:     Head: Normocephalic and atraumatic.  Cardiovascular:     Rate and Rhythm: Normal rate and regular rhythm.     Heart sounds: Normal heart sounds.  Pulmonary:     Effort: Pulmonary effort is normal.     Breath sounds: Normal breath sounds.  Skin:    General: Skin is warm.  Neurological:     General: No focal deficit present.     Mental Status: She is alert.  Psychiatric:        Mood and Affect: Mood normal.        Behavior: Behavior normal.         Assessment And Plan:     1. Class 2 obesity due to excess calories without serious comorbidity with body mass index (BMI) of 35.0 to 35.9 in adult Comments: She  was congratulated on losing 8 pounds since Nov 2022. I will send rx Wegovy 1.7mg  once weekly to pharmacy. She will rto in 8 wks for f/u.   Patient was given opportunity to ask questions. Patient verbalized understanding of the plan and was able to repeat key elements of the plan. All questions were answered to their satisfaction.  Gwynneth Aliment, MD   I, Gwynneth Aliment, MD, have reviewed all documentation for this visit. The documentation on 03/27/20 for the exam, diagnosis, procedures, and orders are all accurate and complete.  THE PATIENT IS ENCOURAGED TO PRACTICE SOCIAL DISTANCING DUE TO THE COVID-19 PANDEMIC.

## 2020-03-10 NOTE — Patient Instructions (Signed)
Obesity, Adult Obesity is the condition of having too much total body fat. Being overweight or obese means that your weight is greater than what is considered healthy for your body size. Obesity is determined by a measurement called BMI. BMI is an estimate of body fat and is calculated from height and weight. For adults, a BMI of 30 or higher is considered obese. Obesity can lead to other health concerns and major illnesses, including:  Stroke.  Coronary artery disease (CAD).  Type 2 diabetes.  Some types of cancer, including cancers of the colon, breast, uterus, and gallbladder.  Osteoarthritis.  High blood pressure (hypertension).  High cholesterol.  Sleep apnea.  Gallbladder stones.  Infertility problems. What are the causes? Common causes of this condition include:  Eating daily meals that are high in calories, sugar, and fat.  Being born with genes that may make you more likely to become obese.  Having a medical condition that causes obesity, including: ? Hypothyroidism. ? Polycystic ovarian syndrome (PCOS). ? Binge-eating disorder. ? Cushing syndrome.  Taking certain medicines, such as steroids, antidepressants, and seizure medicines.  Not being physically active (sedentary lifestyle).  Not getting enough sleep.  Drinking high amounts of sugar-sweetened beverages, such as soft drinks. What increases the risk? The following factors may make you more likely to develop this condition:  Having a family history of obesity.  Being a woman of African American descent.  Being a man of Hispanic descent.  Living in an area with limited access to: ? Romilda Garret, recreation centers, or sidewalks. ? Healthy food choices, such as grocery stores and farmers' markets. What are the signs or symptoms? The main sign of this condition is having too much body fat. How is this diagnosed? This condition is diagnosed based on:  Your BMI. If you are an adult with a BMI of 30 or  higher, you are considered obese.  Your waist circumference. This measures the distance around your waistline.  Your skinfold thickness. Your health care provider may gently pinch a fold of your skin and measure it. You may have other tests to check for underlying conditions. How is this treated? Treatment for this condition often includes changing your lifestyle. Treatment may include some or all of the following:  Dietary changes. This may include developing a healthy meal plan.  Regular physical activity. This may include activity that causes your heart to beat faster (aerobic exercise) and strength training. Work with your health care provider to design an exercise program that works for you.  Medicine to help you lose weight if you are unable to lose 1 pound a week after 6 weeks of healthy eating and more physical activity.  Treating conditions that cause the obesity (underlying conditions).  Surgery. Surgical options may include gastric banding and gastric bypass. Surgery may be done if: ? Other treatments have not helped to improve your condition. ? You have a BMI of 40 or higher. ? You have life-threatening health problems related to obesity. Follow these instructions at home: Eating and drinking   Follow recommendations from your health care provider about what you eat and drink. Your health care provider may advise you to: ? Limit fast food, sweets, and processed snack foods. ? Choose low-fat options, such as low-fat milk instead of whole milk. ? Eat 5 or more servings of fruits or vegetables every day. ? Eat at home more often. This gives you more control over what you eat. ? Choose healthy foods when you eat out. ?  Learn to read food labels. This will help you understand how much food is considered 1 serving. ? Learn what a healthy serving size is. ? Keep low-fat snacks available. ? Limit sugary drinks, such as soda, fruit juice, sweetened iced tea, and flavored  milk.  Drink enough water to keep your urine pale yellow.  Do not follow a fad diet. Fad diets can be unhealthy and even dangerous. Physical activity  Exercise regularly, as told by your health care provider. ? Most adults should get up to 150 minutes of moderate-intensity exercise every week. ? Ask your health care provider what types of exercise are safe for you and how often you should exercise.  Warm up and stretch before being active.  Cool down and stretch after being active.  Rest between periods of activity. Lifestyle  Work with your health care provider and a dietitian to set a weight-loss goal that is healthy and reasonable for you.  Limit your screen time.  Find ways to reward yourself that do not involve food.  Do not drink alcohol if: ? Your health care provider tells you not to drink. ? You are pregnant, may be pregnant, or are planning to become pregnant.  If you drink alcohol: ? Limit how much you use to:  0-1 drink a day for women.  0-2 drinks a day for men. ? Be aware of how much alcohol is in your drink. In the [place] one drink equals one 12 oz bottle of beer (355 mL), one 5 oz glass of wine (148 mL), or one 1 oz glass of hard liquor (44 mL). General instructions  Keep a weight-loss journal to keep track of the food you eat and how much exercise you get.  Take over-the-counter and prescription medicines only as told by your health care provider.  Take vitamins and supplements only as told by your health care provider.  Consider joining a support group. Your health care provider may be able to recommend a support group.  Keep all follow-up visits as told by your health care provider. This is important. Contact a health care provider if:  You are unable to meet your weight loss goal after 6 weeks of dietary and lifestyle changes. Get help right away if you are having:  Trouble breathing.  Suicidal thoughts or behaviors. Summary  Obesity is the  condition of having too much total body fat.  Being overweight or obese means that your weight is greater than what is considered healthy for your body size.  Work with your health care provider and a dietitian to set a weight-loss goal that is healthy and reasonable for you.  Exercise regularly, as told by your health care provider. Ask your health care provider what types of exercise are safe for you and how often you should exercise. This information is not intended to replace advice given to you by your health care provider. Make sure you discuss any questions you have with your health care provider. Document Revised: 10/26/2017 Document Reviewed: 10/26/2017 Elsevier Patient Education  2020 [employer]  

## 2020-04-10 ENCOUNTER — Encounter: Payer: Self-pay | Admitting: Internal Medicine

## 2020-04-13 ENCOUNTER — Other Ambulatory Visit: Payer: Self-pay | Admitting: Internal Medicine

## 2020-04-13 ENCOUNTER — Other Ambulatory Visit: Payer: Self-pay

## 2020-04-13 MED ORDER — WEGOVY 2.4 MG/0.75ML ~~LOC~~ SOAJ: 2.4000 mg | SUBCUTANEOUS | 0 refills | Status: DC

## 2020-04-13 MED FILL — WEGOVY 2.4 MG/0.75ML SOAJ: 2.4 | 28 days supply | Qty: 3 | Fill #0

## 2020-05-05 ENCOUNTER — Other Ambulatory Visit: Payer: Self-pay | Admitting: Internal Medicine

## 2020-05-06 NOTE — Telephone Encounter (Signed)
Dayvigo refill

## 2020-05-08 ENCOUNTER — Other Ambulatory Visit: Payer: Self-pay | Admitting: Internal Medicine

## 2020-05-08 ENCOUNTER — Other Ambulatory Visit: Payer: Self-pay

## 2020-05-08 MED ORDER — WEGOVY 2.4 MG/0.75ML ~~LOC~~ SOAJ: 2.4000 mg | SUBCUTANEOUS | 0 refills | Status: DC

## 2020-05-08 MED FILL — WEGOVY 2.4 MG/0.75ML SOAJ: 2.4 | 28 days supply | Qty: 3 | Fill #0

## 2020-05-11 ENCOUNTER — Encounter: Payer: Self-pay | Admitting: Internal Medicine

## 2020-05-11 ENCOUNTER — Other Ambulatory Visit: Payer: Self-pay

## 2020-05-11 ENCOUNTER — Ambulatory Visit: Payer: PRIVATE HEALTH INSURANCE | Admitting: Internal Medicine

## 2020-05-11 ENCOUNTER — Other Ambulatory Visit: Payer: Self-pay | Admitting: Internal Medicine

## 2020-05-11 VITALS — BP 114/78 | HR 84 | Temp 98.2°F | Ht 63.2 in | Wt 192.8 lb

## 2020-05-11 DIAGNOSIS — F5101 Primary insomnia: Secondary | ICD-10-CM | POA: Diagnosis not present

## 2020-05-11 DIAGNOSIS — Z6833 Body mass index (BMI) 33.0-33.9, adult: Secondary | ICD-10-CM | POA: Diagnosis not present

## 2020-05-11 DIAGNOSIS — E6609 Other obesity due to excess calories: Secondary | ICD-10-CM

## 2020-05-11 MED ORDER — WEGOVY 2.4 MG/0.75ML ~~LOC~~ SOAJ: 2.4000 mg | SUBCUTANEOUS | 3 refills | Status: DC

## 2020-05-11 NOTE — Patient Instructions (Signed)

## 2020-05-11 NOTE — Progress Notes (Signed)
I,Katawbba Wiggins,acting as a Education administrator for Maximino Greenland, MD.,have documented all relevant documentation on the behalf of Maximino Greenland, MD,as directed by  Maximino Greenland, MD while in the presence of Maximino Greenland, MD.  This visit occurred during the SARS-CoV-2 public health emergency.  Safety protocols were in place, including screening questions prior to the visit, additional usage of staff PPE, and extensive cleaning of exam room while observing appropriate contact time as indicated for disinfecting solutions.  Subjective:     Patient ID: Diana Craig , female    DOB: [birth year] , 45 y.o.   MRN: [id number]   Chief Complaint  Patient presents with  . Obesity    HPI  She presents for f/u obesity. She has been taking Wegovy without any issues. She has incorporated more exercise into her daily routine.     Past Medical History:  Diagnosis Date  . Anemia   . Anxiety   . Headache(784.0)    menstrual cycle related     Family History  Problem Relation Age of Onset  . COPD Mother   . Hypertension Mother   . Other Father        health status unknown     Current Outpatient Medications:  .  DAYVIGO 5 MG TABS, TAKE 1 TABLET BY MOUTH EVERY DAY AT BEDTIME AS NEEDED, Disp: 30 tablet, Rfl: 2 .  ibuprofen (ADVIL,MOTRIN) 800 MG tablet, Take 1 tablet (800 mg total) by mouth every 6 (six) hours as needed., Disp: 30 tablet, Rfl: 3 .  Multiple Vitamins-Minerals (MULTIVITAMIN ADULT PO), Take by mouth., Disp: , Rfl:  .  Semaglutide-Weight Management (WEGOVY) 2.4 MG/0.75ML SOAJ, Inject 2.4 mg into the skin once a week., Disp: 3 mL, Rfl: 3   No Known Allergies   Review of Systems  Constitutional: Negative.   Respiratory: Negative.   Cardiovascular: Negative.   Gastrointestinal: Negative.   Psychiatric/Behavioral: Negative.   All other systems reviewed and are negative.    Today's Vitals   05/11/20 1555  BP: 114/78  Pulse: 84  Temp: 98.2 F (36.8 C)  TempSrc: Oral   Weight: 192 lb 12.8 oz (87.5 kg)  Height: 5' 3.2" (1.605 m)   Body mass index is 33.94 kg/m.  Wt Readings from Last 3 Encounters:  05/11/20 192 lb 12.8 oz (87.5 kg)  03/10/20 204 lb (92.5 kg)  01/07/20 212 lb 3.2 oz (96.3 kg)   Objective:  Physical Exam Vitals and nursing note reviewed.  Constitutional:      Appearance: Normal appearance. She is obese.  HENT:     Head: Normocephalic and atraumatic.     Nose:     Comments: Masked     Mouth/Throat:     Comments: Masked  Cardiovascular:     Rate and Rhythm: Normal rate and regular rhythm.     Heart sounds: Normal heart sounds.  Pulmonary:     Breath sounds: Normal breath sounds.  Musculoskeletal:     Cervical back: Normal range of motion.  Skin:    General: Skin is warm.  Neurological:     General: No focal deficit present.     Mental Status: She is alert and oriented to person, place, and time.         Assessment And Plan:     1. Class 1 obesity due to excess calories without serious comorbidity with body mass index (BMI) of 33.0 to 33.9 in adult Comments: She was congratulated on her 20 pound weight loss since  Nov 2021. She will c/w Wegovy. Advised to aim for at least 150 minutes of exercise per week.   2. Primary insomnia Comments: Chronic. She was given refill of Dayvigo. Advised to develop bedtime routine.   Patient was given opportunity to ask questions. Patient verbalized understanding of the plan and was able to repeat key elements of the plan. All questions were answered to their satisfaction.   I, Maximino Greenland, MD, have reviewed all documentation for this visit. The documentation on 05/23/20 for the exam, diagnosis, procedures, and orders are all accurate and complete.  THE PATIENT IS ENCOURAGED TO PRACTICE SOCIAL DISTANCING DUE TO THE COVID-19 PANDEMIC.

## 2020-05-31 ENCOUNTER — Other Ambulatory Visit: Payer: Self-pay | Admitting: Internal Medicine

## 2020-05-31 MED ORDER — LEVOCETIRIZINE DIHYDROCHLORIDE 5 MG PO TABS: 5.0000 mg | ORAL_TABLET | Freq: Every evening | ORAL | 1 refills | Status: DC

## 2020-05-31 MED ORDER — NOREL AD 4-10-325 MG PO TABS: ORAL_TABLET | ORAL | 0 refills | Status: DC

## 2020-06-03 ENCOUNTER — Encounter: Payer: Self-pay | Admitting: Internal Medicine

## 2020-06-03 ENCOUNTER — Other Ambulatory Visit: Payer: Self-pay | Admitting: Nurse Practitioner

## 2020-06-03 ENCOUNTER — Other Ambulatory Visit: Payer: Self-pay | Admitting: Internal Medicine

## 2020-06-03 MED ORDER — PROMETHAZINE-DM 6.25-15 MG/5ML PO SYRP: 5.0000 mL | ORAL_SOLUTION | Freq: Four times a day (QID) | ORAL | 0 refills | Status: DC | PRN

## 2020-06-03 MED ORDER — AZITHROMYCIN 250 MG PO TABS: ORAL_TABLET | ORAL | 0 refills | Status: DC

## 2020-06-03 MED ORDER — GUAIFENESIN-CODEINE 225-7.5 MG/5ML PO LIQD: 5.0000 mL | Freq: Three times a day (TID) | ORAL | 0 refills | Status: DC | PRN

## 2020-06-08 ENCOUNTER — Other Ambulatory Visit: Payer: Self-pay

## 2020-06-08 MED ORDER — SEMAGLUTIDE-WEIGHT MANAGEMENT 2.4 MG/0.75ML ~~LOC~~ SOAJ: SUBCUTANEOUS | 3 refills | Status: DC

## 2020-06-10 ENCOUNTER — Other Ambulatory Visit (HOSPITAL_COMMUNITY): Payer: Self-pay

## 2020-06-10 ENCOUNTER — Other Ambulatory Visit: Payer: Self-pay

## 2020-06-10 MED ORDER — SEMAGLUTIDE-WEIGHT MANAGEMENT 2.4 MG/0.75ML ~~LOC~~ SOAJ: SUBCUTANEOUS | 3 refills | Status: DC

## 2020-06-10 MED ORDER — SEMAGLUTIDE-WEIGHT MANAGEMENT 2.4 MG/0.75ML ~~LOC~~ SOAJ
2.4000 mL | SUBCUTANEOUS | 3 refills | Status: DC
  Filled 2020-06-10 (×2): qty 3, 28d supply, fill #0

## 2020-06-23 ENCOUNTER — Other Ambulatory Visit: Payer: Self-pay | Admitting: Internal Medicine

## 2020-07-03 ENCOUNTER — Encounter: Payer: Self-pay | Admitting: Internal Medicine

## 2020-07-03 ENCOUNTER — Telehealth: Payer: Self-pay

## 2020-07-03 ENCOUNTER — Other Ambulatory Visit (HOSPITAL_COMMUNITY): Payer: Self-pay

## 2020-07-03 ENCOUNTER — Other Ambulatory Visit: Payer: Self-pay

## 2020-07-03 MED ORDER — SEMAGLUTIDE-WEIGHT MANAGEMENT 2.4 MG/0.75ML ~~LOC~~ SOAJ
2.4000 mL | SUBCUTANEOUS | 3 refills | Status: DC
  Filled 2020-07-03: qty 3, 28d supply, fill #0
  Filled 2020-08-03: qty 3, 28d supply, fill #1

## 2020-07-03 NOTE — Telephone Encounter (Signed)
The pt's appt for Monday was cancelled and the pt was told that her weight would be followed up on at her 05/31 visit because she was recently diagnosed with covid.

## 2020-07-06 ENCOUNTER — Ambulatory Visit: Payer: PRIVATE HEALTH INSURANCE | Admitting: Internal Medicine

## 2020-08-04 ENCOUNTER — Other Ambulatory Visit (HOSPITAL_COMMUNITY): Payer: Self-pay

## 2020-08-04 ENCOUNTER — Other Ambulatory Visit: Payer: Self-pay

## 2020-08-04 ENCOUNTER — Encounter: Payer: Self-pay | Admitting: Internal Medicine

## 2020-08-04 ENCOUNTER — Ambulatory Visit (INDEPENDENT_AMBULATORY_CARE_PROVIDER_SITE_OTHER): Payer: PRIVATE HEALTH INSURANCE | Admitting: Internal Medicine

## 2020-08-04 ENCOUNTER — Other Ambulatory Visit: Payer: Self-pay | Admitting: Internal Medicine

## 2020-08-04 VITALS — BP 114/70 | HR 82 | Temp 98.2°F | Ht 63.4 in | Wt 186.4 lb

## 2020-08-04 DIAGNOSIS — Z0000 Encounter for general adult medical examination without abnormal findings: Secondary | ICD-10-CM

## 2020-08-04 DIAGNOSIS — Z6832 Body mass index (BMI) 32.0-32.9, adult: Secondary | ICD-10-CM

## 2020-08-04 DIAGNOSIS — E6609 Other obesity due to excess calories: Secondary | ICD-10-CM

## 2020-08-04 LAB — CMP14+EGFR
ALT: 11 IU/L (ref 0–32)
AST: 15 IU/L (ref 0–40)
Albumin/Globulin Ratio: 1.4 (ref 1.2–2.2)
Albumin: 4.2 g/dL (ref 3.8–4.8)
Alkaline Phosphatase: 77 IU/L (ref 44–121)
BUN/Creatinine Ratio: 10 (ref 9–23)
BUN: 8 mg/dL (ref 6–24)
Bilirubin Total: 0.2 mg/dL (ref 0.0–1.2)
CO2: 22 mmol/L (ref 20–29)
Calcium: 9.2 mg/dL (ref 8.7–10.2)
Chloride: 102 mmol/L (ref 96–106)
Creatinine, Ser: 0.77 mg/dL (ref 0.57–1.00)
Globulin, Total: 2.9 g/dL (ref 1.5–4.5)
Glucose: 86 mg/dL (ref 65–99)
Potassium: 4.7 mmol/L (ref 3.5–5.2)
Sodium: 138 mmol/L (ref 134–144)
Total Protein: 7.1 g/dL (ref 6.0–8.5)
eGFR: 97 mL/min/{1.73_m2} (ref >59)

## 2020-08-04 LAB — CBC
Hematocrit: 39.2 % (ref 34.0–46.6)
Hemoglobin: 12.6 g/dL (ref 11.1–15.9)
MCH: 28.1 pg (ref 26.6–33.0)
MCHC: 32.1 g/dL (ref 31.5–35.7)
MCV: 87 fL (ref 79–97)
Platelets: 288 10*3/uL (ref 150–450)
RBC: 4.49 x10E6/uL (ref 3.77–5.28)
RDW: 13.3 % (ref 11.7–15.4)
WBC: 5.2 10*3/uL (ref 3.4–10.8)

## 2020-08-04 LAB — LIPID PANEL
Chol/HDL Ratio: 3 ratio (ref 0.0–4.4)
Cholesterol, Total: 181 mg/dL (ref 100–199)
HDL: 60 mg/dL (ref >39)
LDL Chol Calc (NIH): 110 mg/dL — ABNORMAL HIGH (ref 0–99)
Triglycerides: 57 mg/dL (ref 0–149)
VLDL Cholesterol Cal: 11 mg/dL (ref 5–40)

## 2020-08-04 MED ORDER — WEGOVY 2.4 MG/0.75ML ~~LOC~~ SOAJ: 2.4000 mg | SUBCUTANEOUS | 3 refills | Status: DC

## 2020-08-04 NOTE — Patient Instructions (Signed)
Health Maintenance, Female Adopting a healthy lifestyle and getting preventive care are important in promoting health and wellness. Ask your health care provider about:  The right schedule for you to have regular tests and exams.  Things you can do on your own to prevent diseases and keep yourself healthy. What should I know about diet, weight, and exercise? Eat a healthy diet  Eat a diet that includes plenty of vegetables, fruits, low-fat dairy products, and lean protein.  Do not eat a lot of foods that are high in solid fats, added sugars, or sodium.   Maintain a healthy weight Body mass index (BMI) is used to identify weight problems. It estimates body fat based on height and weight. Your health care provider can help determine your BMI and help you achieve or maintain a healthy weight. Get regular exercise Get regular exercise. This is one of the most important things you can do for your health. Most adults should:  Exercise for at least 150 minutes each week. The exercise should increase your heart rate and make you sweat (moderate-intensity exercise).  Do strengthening exercises at least twice a week. This is in addition to the moderate-intensity exercise.  Spend less time sitting. Even light physical activity can be beneficial. Watch cholesterol and blood lipids Have your blood tested for lipids and cholesterol at 45 years of age, then have this test every 5 years. Have your cholesterol levels checked more often if:  Your lipid or cholesterol levels are high.  You are older than 45 years of age.  You are at high risk for heart disease. What should I know about cancer screening? Depending on your health history and family history, you may need to have cancer screening at various ages. This may include screening for:  Breast cancer.  Cervical cancer.  Colorectal cancer.  Skin cancer.  Lung cancer. What should I know about heart disease, diabetes, and high blood  pressure? Blood pressure and heart disease  High blood pressure causes heart disease and increases the risk of stroke. This is more likely to develop in people who have high blood pressure readings, are of African descent, or are overweight.  Have your blood pressure checked: ? Every 3-5 years if you are 18-39 years of age. ? Every year if you are 40 years old or older. Diabetes Have regular diabetes screenings. This checks your fasting blood sugar level. Have the screening done:  Once every three years after age 40 if you are at a normal weight and have a low risk for diabetes.  More often and at a younger age if you are overweight or have a high risk for diabetes. What should I know about preventing infection? Hepatitis B If you have a higher risk for hepatitis B, you should be screened for this virus. Talk with your health care provider to find out if you are at risk for hepatitis B infection. Hepatitis C Testing is recommended for:  Everyone born from 1945 through 1965.  Anyone with known risk factors for hepatitis C. Sexually transmitted infections (STIs)  Get screened for STIs, including gonorrhea and chlamydia, if: ? You are sexually active and are younger than 45 years of age. ? You are older than 45 years of age and your health care provider tells you that you are at risk for this type of infection. ? Your sexual activity has changed since you were last screened, and you are at increased risk for chlamydia or gonorrhea. Ask your health care provider   if you are at risk.  Ask your health care provider about whether you are at high risk for HIV. Your health care provider may recommend a prescription medicine to help prevent HIV infection. If you choose to take medicine to prevent HIV, you should first get tested for HIV. You should then be tested every 3 months for as long as you are taking the medicine. Pregnancy  If you are about to stop having your period (premenopausal) and  you may become pregnant, seek counseling before you get pregnant.  Take 400 to 800 micrograms (mcg) of folic acid every day if you become pregnant.  Ask for birth control (contraception) if you want to prevent pregnancy. Osteoporosis and menopause Osteoporosis is a disease in which the bones lose minerals and strength with aging. This can result in bone fractures. If you are 65 years old or older, or if you are at risk for osteoporosis and fractures, ask your health care provider if you should:  Be screened for bone loss.  Take a calcium or vitamin D supplement to lower your risk of fractures.  Be given hormone replacement therapy (HRT) to treat symptoms of menopause. Follow these instructions at home: Lifestyle  Do not use any products that contain nicotine or tobacco, such as cigarettes, e-cigarettes, and chewing tobacco. If you need help quitting, ask your health care provider.  Do not use street drugs.  Do not share needles.  Ask your health care provider for help if you need support or information about quitting drugs. Alcohol use  Do not drink alcohol if: ? Your health care provider tells you not to drink. ? You are pregnant, may be pregnant, or are planning to become pregnant.  If you drink alcohol: ? Limit how much you use to 0-1 drink a day. ? Limit intake if you are breastfeeding.  Be aware of how much alcohol is in your drink. In the [place] one drink equals one 12 oz bottle of beer (355 mL), one 5 oz glass of wine (148 mL), or one 1 oz glass of hard liquor (44 mL). General instructions  Schedule regular health, dental, and eye exams.  Stay current with your vaccines.  Tell your health care provider if: ? You often feel depressed. ? You have ever been abused or do not feel safe at home. Summary  Adopting a healthy lifestyle and getting preventive care are important in promoting health and wellness.  Follow your health care provider's instructions about healthy  diet, exercising, and getting tested or screened for diseases.  Follow your health care provider's instructions on monitoring your cholesterol and blood pressure. This information is not intended to replace advice given to you by your health care provider. Make sure you discuss any questions you have with your health care provider. Document Revised: 02/14/2018 Document Reviewed: 02/14/2018 Elsevier Patient Education  2021 Elsevier Inc.  

## 2020-08-04 NOTE — Progress Notes (Signed)
I,Katawbba Wiggins,acting as a Education administrator for Maximino Greenland, MD.,have documented all relevant documentation on the behalf of Maximino Greenland, MD,as directed by  Maximino Greenland, MD while in the presence of Maximino Greenland, MD.  This visit occurred during the SARS-CoV-2 public health emergency.  Safety protocols were in place, including screening questions prior to the visit, additional usage of staff PPE, and extensive cleaning of exam room while observing appropriate contact time as indicated for disinfecting solutions.  Subjective:     Patient ID: Diana Craig , female    DOB: Aug 05, 1975 , 45 y.o.   MRN: [id number]   Chief Complaint  Patient presents with  . Annual Exam    HPI  She is here today for a full physical exam. She is followed by GYN for her pelvic exams, last pap was 2021.  She has no specific concerns or complaints at this time.     Past Medical History:  Diagnosis Date  . Anemia   . Anxiety   . Headache(784.0)    menstrual cycle related     Family History  Problem Relation Age of Onset  . COPD Mother   . Hypertension Mother   . Other Father        health status unknown     Current Outpatient Medications:  .  Chlorphen-PE-Acetaminophen (NOREL AD) 4-10-325 MG TABS, One tab po bid prn, Disp: 20 tablet, Rfl: 0 .  DAYVIGO 5 MG TABS, TAKE 1 TABLET BY MOUTH EVERY DAY AT BEDTIME AS NEEDED, Disp: 30 tablet, Rfl: 2 .  ibuprofen (ADVIL,MOTRIN) 800 MG tablet, Take 1 tablet (800 mg total) by mouth every 6 (six) hours as needed., Disp: 30 tablet, Rfl: 3 .  levocetirizine (XYZAL) 5 MG tablet, TAKE 1 TABLET BY MOUTH EVERY DAY IN THE EVENING, Disp: 90 tablet, Rfl: 2 .  Multiple Vitamins-Minerals (MULTIVITAMIN ADULT PO), Take by mouth., Disp: , Rfl:  .  Semaglutide-Weight Management 2.4 MG/0.75ML SOAJ, inject 2.43m's into the skin once a week, Disp: 3 mL, Rfl: 3 .  promethazine-dextromethorphan (PROMETHAZINE-DM) 6.25-15 MG/5ML syrup, Take 5 mLs by mouth 4 (four) times  daily as needed for cough. (Patient not taking: Reported on 08/04/2020), Disp: 180 mL, Rfl: 0   No Known Allergies    The patient states she uses none for birth control. Last LMP was Patient's last menstrual period was 07/15/2020.. Negative for Dysmenorrhea. Negative for: breast discharge, breast lump(s), breast pain and breast self exam. Associated symptoms include abnormal vaginal bleeding. Pertinent negatives include abnormal bleeding (hematology), anxiety, decreased libido, depression, difficulty falling sleep, dyspareunia, history of infertility, nocturia, sexual dysfunction, sleep disturbances, urinary incontinence, urinary urgency, vaginal discharge and vaginal itching. Diet regular.The patient states her exercise level is  moderate.  . The patient's tobacco use is:  Social History   Tobacco Use  Smoking Status Never Smoker  Smokeless Tobacco Never Used  Tobacco Comment   n/a  . She has been exposed to passive smoke. The patient's alcohol use is:  Social History   Substance and Sexual Activity  Alcohol Use Yes   Comment: social   Review of Systems  Constitutional: Negative.   HENT: Negative.   Eyes: Negative.   Respiratory: Negative.   Cardiovascular: Negative.   Gastrointestinal: Negative.   Endocrine: Negative.   Genitourinary: Negative.   Musculoskeletal: Negative.   Skin: Negative.   Allergic/Immunologic: Negative.   Neurological: Negative.   Hematological: Negative.   Psychiatric/Behavioral: Negative.      Today's Vitals  08/04/20 0846  BP: 114/70  Pulse: 82  Temp: 98.2 F (36.8 C)  TempSrc: Oral  Weight: 186 lb 6.4 oz (84.6 kg)  Height: 5' 3.4" (1.61 m)   Body mass index is 32.6 kg/m.  Wt Readings from Last 3 Encounters:  08/04/20 186 lb 6.4 oz (84.6 kg)  05/11/20 192 lb 12.8 oz (87.5 kg)  03/10/20 204 lb (92.5 kg)   BP Readings from Last 3 Encounters:  08/04/20 114/70  05/11/20 114/78  03/10/20 120/70   Objective:  Physical Exam Vitals and  nursing note reviewed.  Constitutional:      Appearance: Normal appearance.  HENT:     Head: Normocephalic and atraumatic.     Right Ear: Tympanic membrane, ear canal and external ear normal.     Left Ear: Tympanic membrane, ear canal and external ear normal.     Nose:     Comments: Masked     Mouth/Throat:     Comments: Masked  Eyes:     Extraocular Movements: Extraocular movements intact.     Conjunctiva/sclera: Conjunctivae normal.     Pupils: Pupils are equal, round, and reactive to light.  Cardiovascular:     Rate and Rhythm: Normal rate and regular rhythm.     Pulses: Normal pulses.     Heart sounds: Normal heart sounds.  Pulmonary:     Effort: Pulmonary effort is normal.     Breath sounds: Normal breath sounds.  Chest:  Breasts:     Tanner Score is 5.     Right: Normal.     Left: Normal.    Abdominal:     General: Bowel sounds are normal.     Palpations: Abdomen is soft.  Genitourinary:    Comments: deferred Musculoskeletal:        General: Normal range of motion.     Cervical back: Normal range of motion and neck supple.  Skin:    General: Skin is warm and dry.  Neurological:     General: No focal deficit present.     Mental Status: She is alert and oriented to person, place, and time.  Psychiatric:        Mood and Affect: Mood normal.        Behavior: Behavior normal.         Assessment And Plan:     1. Routine general medical examination at health care facility Comments: A full exam was performed. Importance of monthly self breast exams was discussed with the patient. I will refer her to GI in August for CRC screening. She plans to get COVID booster in the next week or two. PATIENT IS ADVISED TO GET 30-45 MINUTES REGULAR EXERCISE NO LESS THAN FOUR TO FIVE DAYS PER WEEK - BOTH WEIGHTBEARING EXERCISES AND AEROBIC ARE RECOMMENDED.  PATIENT IS ADVISED TO FOLLOW A HEALTHY DIET WITH AT LEAST SIX FRUITS/VEGGIES PER DAY, DECREASE INTAKE OF RED MEAT, AND TO  INCREASE FISH INTAKE TO TWO DAYS PER WEEK.  MEATS/FISH SHOULD NOT BE FRIED, BAKED OR BROILED IS PREFERABLE.  IT IS ALSO IMPORTANT TO CUT BACK ON YOUR SUGAR INTAKE. PLEASE AVOID ANYTHING WITH ADDED SUGAR, CORN SYRUP OR OTHER SWEETENERS. IF YOU MUST USE A SWEETENER, YOU CAN TRY STEVIA. IT IS ALSO IMPORTANT TO AVOID ARTIFICIALLY SWEETENERS AND DIET BEVERAGES. LASTLY, I SUGGEST WEARING SPF 50 SUNSCREEN ON EXPOSED PARTS AND ESPECIALLY WHEN IN THE DIRECT SUNLIGHT FOR AN EXTENDED PERIOD OF TIME.  PLEASE AVOID FAST FOOD RESTAURANTS AND INCREASE YOUR WATER INTAKE.  -  CBC - CMP14+EGFR - Lipid panel  2. Class 1 obesity due to excess calories without serious comorbidity with body mass index (BMI) of 32.0 to 32.9 in adult She was congratulated on her6 pound weight loss since March 2022. She has lost 38 pounds since May 2021.  Advised to aim for at least 150 minutes of exercise per week.  Patient was given opportunity to ask questions. Patient verbalized understanding of the plan and was able to repeat key elements of the plan. All questions were answered to their satisfaction.   I, Maximino Greenland, MD, have reviewed all documentation for this visit. The documentation on 08/04/20 for the exam, diagnosis, procedures, and orders are all accurate and complete.  THE PATIENT IS ENCOURAGED TO PRACTICE SOCIAL DISTANCING DUE TO THE COVID-19 PANDEMIC.

## 2020-08-06 ENCOUNTER — Encounter: Payer: PRIVATE HEALTH INSURANCE | Admitting: Internal Medicine

## 2020-08-10 ENCOUNTER — Other Ambulatory Visit: Payer: Self-pay | Admitting: Internal Medicine

## 2020-08-11 NOTE — Telephone Encounter (Signed)
Please refill patient's prescription YL,RMA 

## 2020-11-04 ENCOUNTER — Other Ambulatory Visit: Payer: Self-pay

## 2020-11-04 ENCOUNTER — Encounter: Payer: Self-pay | Admitting: Internal Medicine

## 2020-11-04 ENCOUNTER — Ambulatory Visit: Payer: No Typology Code available for payment source | Admitting: Internal Medicine

## 2020-11-04 VITALS — BP 110/70 | HR 64 | Temp 97.9°F | Ht 63.4 in | Wt 172.4 lb

## 2020-11-04 DIAGNOSIS — E6609 Other obesity due to excess calories: Secondary | ICD-10-CM | POA: Diagnosis not present

## 2020-11-04 DIAGNOSIS — Z6830 Body mass index (BMI) 30.0-30.9, adult: Secondary | ICD-10-CM

## 2020-11-04 DIAGNOSIS — Z1211 Encounter for screening for malignant neoplasm of colon: Secondary | ICD-10-CM | POA: Diagnosis not present

## 2020-11-04 MED ORDER — WEGOVY 2.4 MG/0.75ML ~~LOC~~ SOAJ: 2.4000 mg | SUBCUTANEOUS | 1 refills | Status: DC

## 2020-11-04 NOTE — Progress Notes (Signed)
I,Yamilka Roman [employer] as a Education administrator for Maximino Greenland, MD.,have documented all relevant documentation on the behalf of Maximino Greenland, MD,as directed by  Maximino Greenland, MD while in the presence of Maximino Greenland, MD.  This visit occurred during the SARS-CoV-2 public health emergency.  Safety protocols were in place, including screening questions prior to the visit, additional usage of staff PPE, and extensive cleaning of exam room while observing appropriate contact time as indicated for disinfecting solutions.  Subjective:     Patient ID: Diana Craig , female    DOB: Apr 06, 1975 , 45 y.o.   MRN: [id number]   Chief Complaint  Patient presents with  . Weight Check    HPI  Patient presents today for a weight check. She is on [facility] 2.'4mg'$  she is tolerating it well. She is now exercising 4 days per week. She has no other concerns at this time.     Past Medical History:  Diagnosis Date  . Anemia   . Anxiety   . Headache(784.0)    menstrual cycle related     Family History  Problem Relation Age of Onset  . COPD Mother   . Hypertension Mother   . Other Father        health status unknown     Current Outpatient Medications:  .  Chlorphen-PE-Acetaminophen (NOREL AD) 4-10-325 MG TABS, One tab po bid prn, Disp: 20 tablet, Rfl: 0 .  DAYVIGO 5 MG TABS, TAKE 1 TABLET BY MOUTH EVERY DAY AT BEDTIME AS NEEDED, Disp: 30 tablet, Rfl: 2 .  ibuprofen (ADVIL,MOTRIN) 800 MG tablet, Take 1 tablet (800 mg total) by mouth every 6 (six) hours as needed., Disp: 30 tablet, Rfl: 3 .  levocetirizine (XYZAL) 5 MG tablet, TAKE 1 TABLET BY MOUTH EVERY DAY IN THE EVENING, Disp: 90 tablet, Rfl: 2 .  Multiple Vitamins-Minerals (MULTIVITAMIN ADULT PO), Take by mouth., Disp: , Rfl:  .  Semaglutide-Weight Management (WEGOVY) 2.4 MG/0.75ML SOAJ, Inject 2.4 mg into the skin once a week., Disp: 3 mL, Rfl: 1   No Known Allergies   Review of Systems  Constitutional: Negative.   Respiratory:  Negative.    Cardiovascular: Negative.   Gastrointestinal: Negative.   Neurological: Negative.   Psychiatric/Behavioral: Negative.      Today's Vitals   11/04/20 0841  BP: 110/70  Pulse: 64  Temp: 97.9 F (36.6 C)  Weight: 172 lb 6.4 oz (78.2 kg)  Height: 5' 3.4" (1.61 m)  PainSc: 0-No pain   Body mass index is 30.16 kg/m.  Wt Readings from Last 3 Encounters:  11/04/20 172 lb 6.4 oz (78.2 kg)  08/04/20 186 lb 6.4 oz (84.6 kg)  05/11/20 192 lb 12.8 oz (87.5 kg)     Objective:  Physical Exam Vitals and nursing note reviewed.  Constitutional:      Appearance: Normal appearance.  HENT:     Head: Normocephalic and atraumatic.     Nose:     Comments: Masked     Mouth/Throat:     Comments: Masked  Eyes:     Extraocular Movements: Extraocular movements intact.  Cardiovascular:     Rate and Rhythm: Normal rate and regular rhythm.     Heart sounds: Normal heart sounds.  Pulmonary:     Effort: Pulmonary effort is normal.     Breath sounds: Normal breath sounds.  Musculoskeletal:     Cervical back: Normal range of motion.  Skin:    General: Skin is warm.  Neurological:  General: No focal deficit present.     Mental Status: She is alert.  Psychiatric:        Mood and Affect: Mood normal.        Behavior: Behavior normal.        Assessment And Plan:     1. Class 1 obesity due to excess calories with body mass index (BMI) of 30.0 to 30.9 in adult, unspecified whether serious comorbidity present Comments: She was congratulated on her 14 pound weight loss since May 2022. She will c/w Wegovy. She will f/u in 12 weeks.   2. Screening for colon cancer Comments: I will refer her to GI for CRC screening.  - Ambulatory referral to Gastroenterology    Patient was given opportunity to ask questions. Patient verbalized understanding of the plan and was able to repeat key elements of the plan. All questions were answered to their satisfaction.   I, Maximino Greenland, MD,  have reviewed all documentation for this visit. The documentation on 11/04/20 for the exam, diagnosis, procedures, and orders are all accurate and complete.   IF YOU HAVE BEEN REFERRED TO A SPECIALIST, IT MAY TAKE 1-2 WEEKS TO SCHEDULE/PROCESS THE REFERRAL. IF YOU HAVE NOT HEARD FROM US/SPECIALIST IN TWO WEEKS, PLEASE GIVE [place] A CALL AT [phone].   THE PATIENT IS ENCOURAGED TO PRACTICE SOCIAL DISTANCING DUE TO THE COVID-19 PANDEMIC.

## 2020-11-04 NOTE — Patient Instructions (Addendum)

## 2020-11-24 ENCOUNTER — Other Ambulatory Visit: Payer: Self-pay | Admitting: Internal Medicine

## 2021-01-29 ENCOUNTER — Other Ambulatory Visit: Payer: Self-pay | Admitting: Internal Medicine

## 2021-02-01 ENCOUNTER — Other Ambulatory Visit: Payer: Self-pay | Admitting: Internal Medicine

## 2021-02-03 ENCOUNTER — Other Ambulatory Visit: Payer: Self-pay

## 2021-02-03 ENCOUNTER — Encounter: Payer: Self-pay | Admitting: Internal Medicine

## 2021-02-03 ENCOUNTER — Ambulatory Visit: Payer: No Typology Code available for payment source | Admitting: Internal Medicine

## 2021-02-03 VITALS — BP 122/64 | HR 72 | Temp 98.0°F | Ht 63.0 in | Wt 162.8 lb

## 2021-02-03 DIAGNOSIS — Z6828 Body mass index (BMI) 28.0-28.9, adult: Secondary | ICD-10-CM

## 2021-02-03 DIAGNOSIS — N920 Excessive and frequent menstruation with regular cycle: Secondary | ICD-10-CM

## 2021-02-03 DIAGNOSIS — E663 Overweight: Secondary | ICD-10-CM | POA: Diagnosis not present

## 2021-02-03 DIAGNOSIS — E559 Vitamin D deficiency, unspecified: Secondary | ICD-10-CM

## 2021-02-03 MED ORDER — WEGOVY 1.7 MG/0.75ML ~~LOC~~ SOAJ: 1.7000 mg | SUBCUTANEOUS | 3 refills | Status: DC

## 2021-02-03 NOTE — Progress Notes (Signed)
I,Tianna Badgett,acting as a Education administrator for Maximino Greenland, MD.,have documented all relevant documentation on the behalf of Maximino Greenland, MD,as directed by  Maximino Greenland, MD while in the presence of Maximino Greenland, MD.  This visit occurred during the SARS-CoV-2 public health emergency.  Safety protocols were in place, including screening questions prior to the visit, additional usage of staff PPE, and extensive cleaning of exam room while observing appropriate contact time as indicated for disinfecting solutions.  Subjective:     Patient ID: Diana Craig , female    DOB: 1975-09-15 , 45 y.o.   MRN: [id number]   Chief Complaint  Patient presents with   Weight Check    HPI  Patient presents today for a weight check. She is on [facility] 2.4mg  she is tolerating it well. She is now exercising 4 days per week. She has no other concerns at this time.     Past Medical History:  Diagnosis Date   Anemia    Anxiety    Headache(784.0)    menstrual cycle related     Family History  Problem Relation Age of Onset   COPD Mother    Hypertension Mother    Other Father        health status unknown     Current Outpatient Medications:    Chlorphen-PE-Acetaminophen (NOREL AD) 4-10-325 MG TABS, One tab po bid prn, Disp: 20 tablet, Rfl: 0   DAYVIGO 5 MG TABS, TAKE 1 TABLET BY MOUTH EVERY DAY AT BEDTIME AS NEEDED, Disp: 30 tablet, Rfl: 1   ibuprofen (ADVIL,MOTRIN) 800 MG tablet, Take 1 tablet (800 mg total) by mouth every 6 (six) hours as needed., Disp: 30 tablet, Rfl: 3   levocetirizine (XYZAL) 5 MG tablet, TAKE 1 TABLET BY MOUTH EVERY DAY IN THE EVENING, Disp: 90 tablet, Rfl: 2   Multiple Vitamins-Minerals (MULTIVITAMIN ADULT PO), Take by mouth., Disp: , Rfl:    Semaglutide-Weight Management (WEGOVY) 2.4 MG/0.75ML SOAJ, Inject 2.4 mg into the skin once a week., Disp: 3 mL, Rfl: 1   No Known Allergies   Review of Systems  Constitutional: Negative.   Respiratory: Negative.     Cardiovascular: Negative.   Gastrointestinal: Negative.   Neurological: Negative.     Today's Vitals   02/03/21 1551  BP: 122/64  Pulse: 72  Temp: 98 F (36.7 C)  TempSrc: Oral  Weight: 162 lb 12.8 oz (73.8 kg)  Height: 5\' 3"  (1.6 m)   Body mass index is 28.84 kg/m.  Wt Readings from Last 3 Encounters:  02/03/21 162 lb 12.8 oz (73.8 kg)  11/04/20 172 lb 6.4 oz (78.2 kg)  08/04/20 186 lb 6.4 oz (84.6 kg)    Objective:  Physical Exam Vitals and nursing note reviewed.  Constitutional:      Appearance: Normal appearance.  HENT:     Head: Normocephalic and atraumatic.     Nose:     Comments: Masked     Mouth/Throat:     Comments: Masked  Cardiovascular:     Rate and Rhythm: Normal rate and regular rhythm.     Heart sounds: Normal heart sounds.  Pulmonary:     Effort: Pulmonary effort is normal.     Breath sounds: Normal breath sounds.  Skin:    General: Skin is warm.  Neurological:     General: No focal deficit present.     Mental Status: She is alert.  Psychiatric:        Mood and Affect: Mood  normal.        Behavior: Behavior normal.        Assessment And Plan:     1. Overweight with body mass index (BMI) of 28 to 28.9 in adult  She has lost another 10 lbs since August 2022! She agrees it is now time to start titrating DOWN her dose. She agrees to decrease to 1.7mg  weekly for her next refill. She is encouraged to keep up the great work! She will f/u in 8 weeks for re-evaluation.   She was given opportunity to ask questions. Patient verbalized understanding of the plan and was able to repeat key elements of the plan. All questions were answered to their satisfaction.   I, Maximino Greenland, MD, have reviewed all documentation for this visit. The documentation on 02/03/21 for the exam, diagnosis, procedures, and orders are all accurate and complete.   IF YOU HAVE BEEN REFERRED TO A SPECIALIST, IT MAY TAKE 1-2 WEEKS TO SCHEDULE/PROCESS THE REFERRAL. IF YOU HAVE  NOT HEARD FROM US/SPECIALIST IN TWO WEEKS, PLEASE GIVE [place] A CALL AT [phone].   THE PATIENT IS ENCOURAGED TO PRACTICE SOCIAL DISTANCING DUE TO THE COVID-19 PANDEMIC.

## 2021-02-03 NOTE — Patient Instructions (Signed)
Obesity, Adult ?Obesity is having too much body fat. Being obese means that your weight is more than what is healthy for you.  ?BMI (body mass index) is a number that explains how much body fat you have. If you have a BMI of 30 or more, you are obese. ?Obesity can cause serious health problems, such as: ?Stroke. ?Coronary artery disease (CAD). ?Type 2 diabetes. ?Some types of cancer. ?High blood pressure (hypertension). ?High cholesterol. ?Gallbladder stones. ?Obesity can also contribute to: ?Osteoarthritis. ?Sleep apnea. ?Infertility problems. ?What are the causes? ?Eating meals each day that are high in calories, sugar, and fat. ?Drinking a lot of drinks that have sugar in them. ?Being born with genes that may make you more likely to become obese. ?Having a medical condition that causes obesity. ?Taking certain medicines. ?Sitting a lot (having a sedentary lifestyle). ?Not getting enough sleep. ?What increases the risk? ?Having a family history of obesity. ?Living in an area with limited access to: ?Parks, recreation centers, or sidewalks. ?Healthy food choices, such as grocery stores and farmers' markets. ?What are the signs or symptoms? ?The main sign is having too much body fat. ?How is this treated? ?Treatment for this condition often includes changing your lifestyle. Treatment may include: ?Changing your diet. This may include making a healthy meal plan. ?Exercise. This may include activity that causes your heart to beat faster (aerobic exercise) and strength training. Work with your doctor to design a program that works for you. ?Medicine to help you lose weight. This may be used if you are not able to lose one pound a week after 6 weeks of healthy eating and more exercise. ?Treating conditions that cause the obesity. ?Surgery. Options may include gastric banding and gastric bypass. This may be done if: ?Other treatments have not helped to improve your condition. ?You have a BMI of 40 or higher. ?You have  life-threatening health problems related to obesity. ?Follow these instructions at home: ?Eating and drinking ? ?Follow advice from your doctor about what to eat and drink. Your doctor may tell you to: ?Limit fast food, sweets, and processed snack foods. ?Choose low-fat options. For example, choose low-fat milk instead of whole milk. ?Eat five or more servings of fruits or vegetables each day. ?Eat at home more often. This gives you more control over what you eat. ?Choose healthy foods when you eat out. ?Learn to read food labels. This will help you learn how much food is in one serving. ?Keep low-fat snacks available. ?Avoid drinks that have a lot of sugar in them. These include soda, fruit juice, iced tea with sugar, and flavored milk. ?Drink enough water to keep your pee (urine) pale yellow. ?Do not go on fad diets. ?Physical activity ?Exercise often, as told by your doctor. Most adults should get up to 150 minutes of moderate-intensity exercise every week.Ask your doctor: ?What types of exercise are safe for you. ?How often you should exercise. ?Warm up and stretch before being active. ?Do slow stretching after being active (cool down). ?Rest between times of being active. ?Lifestyle ?Work with your doctor and a food expert (dietitian) to set a weight-loss goal that is best for you. ?Limit your screen time. ?Find ways to reward yourself that do not involve food. ?Do not drink alcohol if: ?Your doctor tells you not to drink. ?You are pregnant, may be pregnant, or are planning to become pregnant. ?If you drink alcohol: ?Limit how much you have to: ?0-1 drink a day for women. ?0-2 drinks   a day for men. Know how much alcohol is in your drink. In the [place] one drink equals one 12 oz bottle of beer (355 mL), one 5 oz glass of wine (148 mL), or one 1 oz glass of hard liquor (44 mL). General instructions Keep a weight-loss journal. This can help you keep track of: The food that you eat. How much exercise you  get. Take over-the-counter and prescription medicines only as told by your doctor. Take vitamins and supplements only as told by your doctor. Think about joining a support group. Pay attention to your mental health as obesity can lead to depression or self esteem issues. Keep all follow-up visits. Contact a doctor if: You cannot meet your weight-loss goal after you have changed your diet and lifestyle for 6 weeks. You are having trouble breathing. Summary Obesity is having too much body fat. Being obese means that your weight is more than what is healthy for you. Work with your doctor to set a weight-loss goal. Get regular exercise as told by your doctor. This information is not intended to replace advice given to you by your health care provider. Make sure you discuss any questions you have with your health care provider. Document Revised: 09/29/2020 Document Reviewed: 09/29/2020 Elsevier Patient Education  [place].

## 2021-02-04 LAB — CBC
Hematocrit: 38.2 % (ref 34.0–46.6)
Hemoglobin: 12.5 g/dL (ref 11.1–15.9)
MCH: 28 pg (ref 26.6–33.0)
MCHC: 32.7 g/dL (ref 31.5–35.7)
MCV: 86 fL (ref 79–97)
Platelets: 314 10*3/uL (ref 150–450)
RBC: 4.46 x10E6/uL (ref 3.77–5.28)
RDW: 13.3 % (ref 11.7–15.4)
WBC: 4.4 10*3/uL (ref 3.4–10.8)

## 2021-02-04 LAB — CMP14+EGFR
ALT: 10 IU/L (ref 0–32)
AST: 21 IU/L (ref 0–40)
Albumin/Globulin Ratio: 1.5 (ref 1.2–2.2)
Albumin: 4.1 g/dL (ref 3.8–4.8)
Alkaline Phosphatase: 67 IU/L (ref 44–121)
BUN/Creatinine Ratio: 13 (ref 9–23)
BUN: 9 mg/dL (ref 6–24)
Bilirubin Total: 0.2 mg/dL (ref 0.0–1.2)
CO2: 25 mmol/L (ref 20–29)
Calcium: 9.2 mg/dL (ref 8.7–10.2)
Chloride: 105 mmol/L (ref 96–106)
Creatinine, Ser: 0.71 mg/dL (ref 0.57–1.00)
Globulin, Total: 2.7 g/dL (ref 1.5–4.5)
Glucose: 74 mg/dL (ref 70–99)
Potassium: 4.4 mmol/L (ref 3.5–5.2)
Sodium: 141 mmol/L (ref 134–144)
Total Protein: 6.8 g/dL (ref 6.0–8.5)
eGFR: 107 mL/min/{1.73_m2} (ref >59)

## 2021-02-04 LAB — VITAMIN D 25 HYDROXY (VIT D DEFICIENCY, FRACTURES): Vit D, 25-Hydroxy: 42.8 ng/mL (ref 30.0–100.0)

## 2021-03-10 ENCOUNTER — Encounter: Payer: Self-pay | Admitting: Internal Medicine

## 2021-03-17 LAB — HM PAP SMEAR

## 2021-03-24 ENCOUNTER — Other Ambulatory Visit: Payer: Self-pay | Admitting: Obstetrics and Gynecology

## 2021-03-24 DIAGNOSIS — Z1231 Encounter for screening mammogram for malignant neoplasm of breast: Secondary | ICD-10-CM

## 2021-04-02 ENCOUNTER — Ambulatory Visit
Admission: RE | Admit: 2021-04-02 | Discharge: 2021-04-02 | Disposition: A | Discharge status: Home / Self Care | Payer: PRIVATE HEALTH INSURANCE | Source: Ambulatory Visit

## 2021-04-02 DIAGNOSIS — Z1231 Encounter for screening mammogram for malignant neoplasm of breast: Secondary | ICD-10-CM

## 2021-04-09 ENCOUNTER — Other Ambulatory Visit: Payer: Self-pay | Admitting: Obstetrics and Gynecology

## 2021-04-09 DIAGNOSIS — N6320 Unspecified lump in the left breast, unspecified quadrant: Secondary | ICD-10-CM

## 2021-04-14 ENCOUNTER — Ambulatory Visit: Payer: PRIVATE HEALTH INSURANCE | Admitting: Internal Medicine

## 2021-04-14 ENCOUNTER — Other Ambulatory Visit: Payer: Self-pay

## 2021-04-14 ENCOUNTER — Encounter: Payer: Self-pay | Admitting: Internal Medicine

## 2021-04-14 VITALS — BP 110/78 | HR 66 | Temp 97.6°F | Ht 62.8 in | Wt 161.6 lb

## 2021-04-14 DIAGNOSIS — M25562 Pain in left knee: Secondary | ICD-10-CM | POA: Diagnosis not present

## 2021-04-14 DIAGNOSIS — E669 Obesity, unspecified: Secondary | ICD-10-CM | POA: Diagnosis not present

## 2021-04-14 DIAGNOSIS — G8929 Other chronic pain: Secondary | ICD-10-CM | POA: Diagnosis not present

## 2021-04-14 DIAGNOSIS — Z6828 Body mass index (BMI) 28.0-28.9, adult: Secondary | ICD-10-CM | POA: Diagnosis not present

## 2021-04-14 MED ORDER — WEGOVY 1.7 MG/0.75ML ~~LOC~~ SOAJ: 1.7000 mg | SUBCUTANEOUS | 3 refills | Status: DC

## 2021-04-14 NOTE — Progress Notes (Signed)
Rich Brave Llittleton,acting as a Education administrator for Maximino Greenland, MD.,have documented all relevant documentation on the behalf of Maximino Greenland, MD,as directed by  Maximino Greenland, MD while in the presence of Maximino Greenland, MD.  This visit occurred during the SARS-CoV-2 public health emergency.  Safety protocols were in place, including screening questions prior to the visit, additional usage of staff PPE, and extensive cleaning of exam room while observing appropriate contact time as indicated for disinfecting solutions.  Subjective:     Patient ID: Diana Craig , female    DOB: 24-Nov-1975 , 46 y.o.   MRN: [id number]   Chief Complaint  Patient presents with   Weight Check    HPI  Patient presents today for a weight check. She has been taking Wegovy without any issues, currently on 1.7mg  weekly dose. She is still exercising five days weekly.     Past Medical History:  Diagnosis Date   Anemia    Anxiety    Headache(784.0)    menstrual cycle related     Family History  Problem Relation Age of Onset   COPD Mother    Hypertension Mother    Other Father        health status unknown   Breast cancer Neg Hx      Current Outpatient Medications:    Chlorphen-PE-Acetaminophen (NOREL AD) 4-10-325 MG TABS, One tab po bid prn, Disp: 20 tablet, Rfl: 0   DAYVIGO 5 MG TABS, TAKE 1 TABLET BY MOUTH EVERY DAY AT BEDTIME AS NEEDED, Disp: 30 tablet, Rfl: 1   ibuprofen (ADVIL,MOTRIN) 800 MG tablet, Take 1 tablet (800 mg total) by mouth every 6 (six) hours as needed., Disp: 30 tablet, Rfl: 3   Multiple Vitamins-Minerals (MULTIVITAMIN ADULT PO), Take by mouth., Disp: , Rfl:    Semaglutide-Weight Management (WEGOVY) 1.7 MG/0.75ML SOAJ, Inject 1.7 mg into the skin once a week., Disp: 3 mL, Rfl: 3   No Known Allergies   Review of Systems  Constitutional: Negative.   Respiratory: Negative.    Cardiovascular: Negative.   Gastrointestinal: Negative.   Neurological: Negative.    Psychiatric/Behavioral: Negative.      Today's Vitals   04/14/21 1455  BP: 110/78  Pulse: 66  Temp: 97.6 F (36.4 C)  Weight: 161 lb 9.6 oz (73.3 kg)  Height: 5' 2.8" (1.595 m)  PainSc: 0-No pain   Body mass index is 28.81 kg/m.  Wt Readings from Last 3 Encounters:  04/14/21 161 lb 9.6 oz (73.3 kg)  02/03/21 162 lb 12.8 oz (73.8 kg)  11/04/20 172 lb 6.4 oz (78.2 kg)     Objective:  Physical Exam Vitals and nursing note reviewed.  Constitutional:      Appearance: Normal appearance.  HENT:     Head: Normocephalic and atraumatic.     Nose:     Comments: Masked     Mouth/Throat:     Comments: Masked  Eyes:     Extraocular Movements: Extraocular movements intact.  Cardiovascular:     Rate and Rhythm: Normal rate and regular rhythm.     Heart sounds: Normal heart sounds.  Pulmonary:     Effort: Pulmonary effort is normal.     Breath sounds: Normal breath sounds.  Musculoskeletal:     Cervical back: Normal range of motion.  Skin:    General: Skin is warm.  Neurological:     General: No focal deficit present.     Mental Status: She is alert.  Psychiatric:  Mood and Affect: Mood normal.        Behavior: Behavior normal.      Assessment And Plan:     1. BMI 28.0-28.9,adult Comments: She will c/w Wegovy 1.7mg  weekly. Her goal weight is 160 lbs. She will f/u in 10 weeks.   2. Chronic pain of left knee Comments: She is now under the care of [facility]. She is scheduled for MRI knee b/c x-rays were normal.Per Ortho - sx c/w patellofemoral pain syndrome.   Patient was given opportunity to ask questions. Patient verbalized understanding of the plan and was able to repeat key elements of the plan. All questions were answered to their satisfaction.   I, Maximino Greenland, MD, have reviewed all documentation for this visit. The documentation on 04/14/21 for the exam, diagnosis, procedures, and orders are all accurate and complete.   IF YOU HAVE BEEN REFERRED TO A  SPECIALIST, IT MAY TAKE 1-2 WEEKS TO SCHEDULE/PROCESS THE REFERRAL. IF YOU HAVE NOT HEARD FROM US/SPECIALIST IN TWO WEEKS, PLEASE GIVE [place] A CALL AT [phone].   THE PATIENT IS ENCOURAGED TO PRACTICE SOCIAL DISTANCING DUE TO THE COVID-19 PANDEMIC.

## 2021-04-14 NOTE — Patient Instructions (Signed)

## 2021-05-07 ENCOUNTER — Other Ambulatory Visit: Payer: Self-pay

## 2021-05-07 ENCOUNTER — Ambulatory Visit
Admission: RE | Admit: 2021-05-07 | Discharge: 2021-05-07 | Disposition: A | Discharge status: Home / Self Care | Payer: PRIVATE HEALTH INSURANCE | Source: Ambulatory Visit | Attending: Obstetrics and Gynecology | Admitting: Obstetrics and Gynecology

## 2021-05-07 ENCOUNTER — Other Ambulatory Visit: Payer: Self-pay | Admitting: Obstetrics and Gynecology

## 2021-05-07 DIAGNOSIS — N6320 Unspecified lump in the left breast, unspecified quadrant: Secondary | ICD-10-CM

## 2021-05-13 ENCOUNTER — Other Ambulatory Visit: Payer: PRIVATE HEALTH INSURANCE

## 2021-05-15 ENCOUNTER — Other Ambulatory Visit: Payer: Self-pay | Admitting: Internal Medicine

## 2021-06-23 ENCOUNTER — Telehealth (INDEPENDENT_AMBULATORY_CARE_PROVIDER_SITE_OTHER): Payer: PRIVATE HEALTH INSURANCE | Admitting: Internal Medicine

## 2021-06-23 ENCOUNTER — Encounter: Payer: Self-pay | Admitting: Internal Medicine

## 2021-06-23 VITALS — Ht 63.0 in | Wt 158.2 lb

## 2021-06-23 DIAGNOSIS — Z6828 Body mass index (BMI) 28.0-28.9, adult: Secondary | ICD-10-CM

## 2021-06-23 DIAGNOSIS — E559 Vitamin D deficiency, unspecified: Secondary | ICD-10-CM

## 2021-06-23 MED ORDER — WEGOVY 1 MG/0.5ML ~~LOC~~ SOAJ: 1.0000 mg | SUBCUTANEOUS | 1 refills | Status: DC

## 2021-06-23 NOTE — Patient Instructions (Signed)

## 2021-06-23 NOTE — Progress Notes (Signed)
? ?Virtual Visit via Video  ? ?This visit type was conducted due to national recommendations for restrictions regarding the COVID-19 Pandemic (e.g. social distancing) in an effort to limit this patient's exposure and mitigate transmission in our community.  Due to her co-morbid illnesses, this patient is at least at moderate risk for complications without adequate follow up.  This format is felt to be most appropriate for this patient at this time.  All issues noted in this document were discussed and addressed.  A limited physical exam was performed with this format.   ? ?This visit type was conducted due to national recommendations for restrictions regarding the COVID-19 Pandemic (e.g. social distancing) in an effort to limit this patient's exposure and mitigate transmission in our community.  Patients identity confirmed using two different identifiers.  This format is felt to be most appropriate for this patient at this time.  All issues noted in this document were discussed and addressed.  No physical exam was performed (except for noted visual exam findings with Video Visits).   ? ?Date:  06/23/2021  ? ?ID:  Diana Craig, DOB 01-Jan-1976, MRN [id number] ? ?Patient Location:  ?Home ? ?Provider location:   ?Office ? ? ? ?Chief Complaint:  "I have Wegovy f/u" ? ?History of Present Illness:   ? ?Diana Craig is a 46 y.o. female who presents via video conferencing for a telehealth visit today.   ? ?The patient does not have symptoms concerning for COVID-19 infection (fever, chills, cough, or new shortness of breath).  ? ?She presents today for virtual visit. She prefers this method of contact due to COVID-19 pandemic.  She presents today for a Wegovy f/u. She is down to 1.'7mg'$  weekly. She has noticed a slight increase in appetite. She feels fine.  She is now drinking 80 ounces of water daily. She exercises 3-4 days per week, 45 minutes.  ?  ?  ? ?Past Medical History:  ?Diagnosis Date  ? Anemia   ?  Anxiety   ? Headache(784.0)   ? menstrual cycle related  ? ?Past Surgical History:  ?Procedure Laterality Date  ? BUNIONECTOMY    ? MYOMECTOMY N/A 02/14/2013  ? Procedure: Exploratory Laparotomy MYOMECTOMY;  Surgeon: Marvene Staff, MD;  Location: [facility];  Service: Gynecology;  Laterality: N/A;  2 1/2 hrs.  ? TOE SURGERY    ?  ? ?Current Meds  ?Medication Sig  ? Chlorphen-PE-Acetaminophen (NOREL AD) 4-10-325 MG TABS One tab po bid prn  ? DAYVIGO 5 MG TABS TAKE 1 TABLET BY MOUTH EVERY DAY AT BEDTIME AS NEEDED  ? ibuprofen (ADVIL,MOTRIN) 800 MG tablet Take 1 tablet (800 mg total) by mouth every 6 (six) hours as needed.  ? Multiple Vitamins-Minerals (MULTIVITAMIN ADULT PO) Take by mouth.  ? Semaglutide-Weight Management (WEGOVY) 1 MG/0.5ML SOAJ Inject 1 mg into the skin once a week.  ? [DISCONTINUED] Semaglutide-Weight Management (WEGOVY) 1.7 MG/0.75ML SOAJ Inject 1.7 mg into the skin once a week.  ?  ? ?Allergies:   Patient has no known allergies.  ? ?Social History  ? ?Tobacco Use  ? Smoking status: Never  ? Smokeless tobacco: Never  ? Tobacco comments:  ?  n/a  ?Vaping Use  ? Vaping Use: Never used  ?Substance Use Topics  ? Alcohol use: Yes  ?  Comment: social  ? Drug use: No  ?  ? ?Family Hx: ?The patient's family history includes COPD in her mother; Hypertension in her mother; Other in her father.  There is no history of Breast cancer. ? ?ROS:   ?Please see the history of present illness.    ?Review of Systems  ?Constitutional: Negative.   ?Respiratory: Negative.    ?Cardiovascular: Negative.   ?Gastrointestinal: Negative.   ?Neurological: Negative.   ?Psychiatric/Behavioral: Negative.     ?All other systems reviewed and are negative. ? ? ?Labs/Other Tests and Data Reviewed:   ? ?Recent Labs: ?02/03/2021: ALT 10; BUN 9; Creatinine, Ser 0.71; Hemoglobin 12.5; Platelets 314; Potassium 4.4; Sodium 141  ? ?Recent Lipid Panel ?Lab Results  ?Component Value Date/Time  ? CHOL 181 08/04/2020 09:41 AM  ? TRIG 57  08/04/2020 09:41 AM  ? HDL 60 08/04/2020 09:41 AM  ? CHOLHDL 3.0 08/04/2020 09:41 AM  ? LDLCALC 110 (H) 08/04/2020 09:41 AM  ? ? ?Exam:   ? ?Vital Signs:  Ht '5\' 3"'$  (1.6 m)   Wt 158 lb 3.2 oz (71.8 kg)   BMI 28.02 kg/m?   ? ?Wt Readings from Last 3 Encounters:  ?06/23/21 158 lb 3.2 oz (71.8 kg)  ?04/14/21 161 lb 9.6 oz (73.3 kg)  ?02/03/21 162 lb 12.8 oz (73.8 kg)  ? ? ? ?Physical Exam ?Vitals and nursing note reviewed.  ?HENT:  ?   Head: Normocephalic and atraumatic.  ?Eyes:  ?   Extraocular Movements: Extraocular movements intact.  ?Pulmonary:  ?   Effort: Pulmonary effort is normal.  ?Musculoskeletal:  ?   Cervical back: Normal range of motion.  ?Neurological:  ?   Mental Status: She is alert and oriented to person, place, and time.  ?Psychiatric:     ?   Mood and Affect: Affect normal.  ? ?ASSESSMENT & PLAN:   ? ?1. BMI 28.0-28.9,adult ?Comments: She was congraulated on her continued weight loss, she is at goal weight. I will decrease Wegovy to '1mg'$ /weekly. She will f/u June 2023 for her CPE. ? ?2. Vitamin D deficiency disease ?Comments: She is now taking 3-2000IU tablets daily. She is advised to switch to capsules for better absorption. ? ?COVID-19 Education: ?The signs and symptoms of COVID-19 were discussed with the patient and how to seek care for testing (follow up with PCP or arrange E-visit).  The importance of social distancing was discussed today. ? ?Patient Risk:   ?After full review of this patients clinical status, I feel that they are at least moderate risk at this time. ? ?Time:   ?Today, I have spent 13 minutes/ seconds with the patient with telehealth technology discussing above diagnoses.   ? ? ?Medication Adjustments/Labs and Tests Ordered: ?Current medicines are reviewed at length with the patient today.  Concerns regarding medicines are outlined above.  ? ?Tests Ordered: ?No orders of the defined types were placed in this encounter. ? ? ?Medication Changes: ?Meds ordered this encounter   ?Medications  ? Semaglutide-Weight Management (WEGOVY) 1 MG/0.5ML SOAJ  ?  Sig: Inject 1 mg into the skin once a week.  ?  Dispense:  2 mL  ?  Refill:  1  ? ? ?Disposition:  Follow up prn ? ?Signed, ?Maximino Greenland, MD  ?  ?

## 2021-07-23 HISTORY — PX: KNEE ARTHROSCOPY: SUR90

## 2021-07-23 HISTORY — PX: KNEE ARTHROSCOPY W/ MENISCAL REPAIR: SHX1877

## 2021-08-05 ENCOUNTER — Encounter: Payer: Self-pay | Admitting: Internal Medicine

## 2021-08-05 ENCOUNTER — Ambulatory Visit (INDEPENDENT_AMBULATORY_CARE_PROVIDER_SITE_OTHER): Payer: PRIVATE HEALTH INSURANCE | Admitting: Internal Medicine

## 2021-08-05 VITALS — BP 110/78 | HR 69 | Temp 97.8°F | Ht 63.6 in | Wt 154.4 lb

## 2021-08-05 DIAGNOSIS — Z0000 Encounter for general adult medical examination without abnormal findings: Secondary | ICD-10-CM

## 2021-08-05 DIAGNOSIS — Z6826 Body mass index (BMI) 26.0-26.9, adult: Secondary | ICD-10-CM | POA: Diagnosis not present

## 2021-08-05 DIAGNOSIS — H6123 Impacted cerumen, bilateral: Secondary | ICD-10-CM

## 2021-08-05 DIAGNOSIS — E559 Vitamin D deficiency, unspecified: Secondary | ICD-10-CM

## 2021-08-05 MED ORDER — WEGOVY 1 MG/0.5ML ~~LOC~~ SOAJ
1.0000 mg | SUBCUTANEOUS | 1 refills | Status: DC
Start: 2021-08-05 — End: 2021-11-02

## 2021-08-05 NOTE — Progress Notes (Unsigned)
Rich Brave Llittleton,acting as a Education administrator for Maximino Greenland, MD.,have documented all relevant documentation on the behalf of Maximino Greenland, MD,as directed by  Maximino Greenland, MD while in the presence of Maximino Greenland, MD.  This visit occurred during the SARS-CoV-2 public health emergency.  Safety protocols were in place, including screening questions prior to the visit, additional usage of staff PPE, and extensive cleaning of exam room while observing appropriate contact time as indicated for disinfecting solutions.  Subjective:     Patient ID: Diana Craig , female    DOB: 1975-09-09 , 46 y.o.   MRN: [id number]   Chief Complaint  Patient presents with   Annual Exam    HPI  She is here today for a full physical exam. She is followed by Dr. Garwin Brothers for her pelvic exams. She has no specific concerns or complaints at this time. She is UTD with CRC screening, colonoscopy performed last year.     Past Medical History:  Diagnosis Date   Anemia    Anxiety    Headache(784.0)    menstrual cycle related     Family History  Problem Relation Age of Onset   COPD Mother    Hypertension Mother    Other Father        health status unknown   Breast cancer Neg Hx      Current Outpatient Medications:    Chlorphen-PE-Acetaminophen (NOREL AD) 4-10-325 MG TABS, One tab po bid prn, Disp: 20 tablet, Rfl: 0   DAYVIGO 5 MG TABS, TAKE 1 TABLET BY MOUTH EVERY DAY AT BEDTIME AS NEEDED, Disp: 30 tablet, Rfl: 2   ibuprofen (ADVIL,MOTRIN) 800 MG tablet, Take 1 tablet (800 mg total) by mouth every 6 (six) hours as needed., Disp: 30 tablet, Rfl: 3   Multiple Vitamins-Minerals (MULTIVITAMIN ADULT PO), Take by mouth., Disp: , Rfl:    [employer] ASPIRIN LOW DOSE 81 MG tablet, SMARTSIG:1 Tablet(s) By Mouth Morning-Night, Disp: , Rfl:    Semaglutide-Weight Management (WEGOVY) 1 MG/0.5ML SOAJ, Inject 1 mg into the skin once a week., Disp: 2 mL, Rfl: 1   No Known Allergies    The patient states she  uses none for birth control. Last LMP was Patient's last menstrual period was 07/28/2021.. Negative for Dysmenorrhea. Negative for: breast discharge, breast lump(s), breast pain and breast self exam. Associated symptoms include abnormal vaginal bleeding. Pertinent negatives include abnormal bleeding (hematology), anxiety, decreased libido, depression, difficulty falling sleep, dyspareunia, history of infertility, nocturia, sexual dysfunction, sleep disturbances, urinary incontinence, urinary urgency, vaginal discharge and vaginal itching. Diet regular.The patient states her exercise level is  moderate.  . The patient's tobacco use is:  Social History   Tobacco Use  Smoking Status Never  Smokeless Tobacco Never  Tobacco Comments   n/a  . She has been exposed to passive smoke. The patient's alcohol use is:  Social History   Substance and Sexual Activity  Alcohol Use Yes   Comment: social   Review of Systems  Constitutional: Negative.   HENT: Negative.    Eyes: Negative.   Respiratory: Negative.    Cardiovascular: Negative.   Gastrointestinal: Negative.   Endocrine: Negative.   Genitourinary: Negative.   Musculoskeletal: Negative.   Skin: Negative.   Allergic/Immunologic: Negative.   Neurological: Negative.   Hematological: Negative.   Psychiatric/Behavioral: Negative.      Today's Vitals   08/05/21 0847  BP: 110/78  Pulse: 69  Temp: 97.8 F (36.6 C)  Weight: 154 lb 6.4  oz (70 kg)  Height: 5' 3.6" (1.615 m)  PainSc: 0-No pain   Body mass index is 26.84 kg/m.  Wt Readings from Last 3 Encounters:  08/05/21 154 lb 6.4 oz (70 kg)  06/23/21 158 lb 3.2 oz (71.8 kg)  04/14/21 161 lb 9.6 oz (73.3 kg)    Objective:  Physical Exam Vitals and nursing note reviewed.  Constitutional:      Appearance: Normal appearance.  HENT:     Head: Normocephalic and atraumatic.     Right Ear: Ear canal and external ear normal. There is impacted cerumen.     Left Ear: Ear canal and  external ear normal. There is impacted cerumen.     Nose: Nose normal.     Mouth/Throat:     Mouth: Mucous membranes are moist.     Pharynx: Oropharynx is clear.  Eyes:     Extraocular Movements: Extraocular movements intact.     Conjunctiva/sclera: Conjunctivae normal.     Pupils: Pupils are equal, round, and reactive to light.  Cardiovascular:     Rate and Rhythm: Normal rate and regular rhythm.     Pulses: Normal pulses.     Heart sounds: Normal heart sounds.  Pulmonary:     Effort: Pulmonary effort is normal.     Breath sounds: Normal breath sounds.  Chest:  Breasts:    Tanner Score is 5.     Right: Normal.     Left: Normal.  Abdominal:     General: Abdomen is flat. Bowel sounds are normal.     Palpations: Abdomen is soft.  Genitourinary:    Comments: deferred Musculoskeletal:        General: Normal range of motion.     Cervical back: Normal range of motion and neck supple.  Skin:    General: Skin is warm and dry.  Neurological:     General: No focal deficit present.     Mental Status: She is alert and oriented to person, place, and time.  Psychiatric:        Mood and Affect: Mood normal.        Behavior: Behavior normal.     Assessment And Plan:     1. Encounter for general adult medical examination w/o abnormal findings Comments: A full exam was performed. Importance of monthly self breast exams was discussed with the patient. PATIENT IS ADVISED TO GET 30-45 MINUTES REGULAR EXERCISE NO LESS THAN FOUR TO FIVE DAYS PER WEEK - BOTH WEIGHTBEARING EXERCISES AND AEROBIC ARE RECOMMENDED.  PATIENT IS ADVISED TO FOLLOW A HEALTHY DIET WITH AT LEAST SIX FRUITS/VEGGIES PER DAY, DECREASE INTAKE OF RED MEAT, AND TO INCREASE FISH INTAKE TO TWO DAYS PER WEEK.  MEATS/FISH SHOULD NOT BE FRIED, BAKED OR BROILED IS PREFERABLE.  IT IS ALSO IMPORTANT TO CUT BACK ON YOUR SUGAR INTAKE. PLEASE AVOID ANYTHING WITH ADDED SUGAR, CORN SYRUP OR OTHER SWEETENERS. IF YOU MUST USE A SWEETENER, YOU CAN  TRY STEVIA. IT IS ALSO IMPORTANT TO AVOID ARTIFICIALLY SWEETENERS AND DIET BEVERAGES. LASTLY, I SUGGEST WEARING SPF 50 SUNSCREEN ON EXPOSED PARTS AND ESPECIALLY WHEN IN THE DIRECT SUNLIGHT FOR AN EXTENDED PERIOD OF TIME.  PLEASE AVOID FAST FOOD RESTAURANTS AND INCREASE YOUR WATER INTAKE. - CBC - CMP14+EGFR - Lipid panel  2. Bilateral impacted cerumen AFTER OBTAINING VERBAL CONSENT, BOTH EARS WERE FLUSHED BY IRRIGATION. SHE TOLERATED PROCEDURE WELL WITHOUT ANY COMPLICATIONS. NO TM ABNORMALITIES WERE NOTED. - Ear Lavage  3. BMI 26.0-26.9,adult Comments: She was congratulated on her 30lb  weight loss since May 2021. We have been titrating her Wegovy; however, unable to now due to backorder. She agrees to change her dosing schedule to every other week.    Paatient was given opportunity to ask questions. Patient verbalized understanding of the plan and was able to repeat key elements of the plan. All questions were answered to their satisfaction.   I, Maximino Greenland, MD, have reviewed all documentation for this visit. The documentation on 08/05/21 for the exam, diagnosis, procedures, and orders are all accurate and complete.   THE PATIENT IS ENCOURAGED TO PRACTICE SOCIAL DISTANCING DUE TO THE COVID-19 PANDEMIC.

## 2021-08-05 NOTE — Patient Instructions (Signed)

## 2021-08-06 DIAGNOSIS — Z6829 Body mass index (BMI) 29.0-29.9, adult: Secondary | ICD-10-CM | POA: Insufficient documentation

## 2021-08-06 DIAGNOSIS — H6123 Impacted cerumen, bilateral: Secondary | ICD-10-CM | POA: Insufficient documentation

## 2021-08-06 DIAGNOSIS — Z6828 Body mass index (BMI) 28.0-28.9, adult: Secondary | ICD-10-CM | POA: Insufficient documentation

## 2021-08-06 DIAGNOSIS — Z6826 Body mass index (BMI) 26.0-26.9, adult: Secondary | ICD-10-CM | POA: Insufficient documentation

## 2021-08-06 LAB — CMP14+EGFR
ALT: 10 IU/L (ref 0–32)
AST: 20 IU/L (ref 0–40)
Albumin/Globulin Ratio: 1.6 (ref 1.2–2.2)
Albumin: 4.2 g/dL (ref 3.8–4.8)
Alkaline Phosphatase: 63 IU/L (ref 44–121)
BUN/Creatinine Ratio: 19 (ref 9–23)
BUN: 14 mg/dL (ref 6–24)
Bilirubin Total: <0.2 mg/dL (ref 0.0–1.2)
CO2: 24 mmol/L (ref 20–29)
Calcium: 8.6 mg/dL — ABNORMAL LOW (ref 8.7–10.2)
Chloride: 101 mmol/L (ref 96–106)
Creatinine, Ser: 0.72 mg/dL (ref 0.57–1.00)
Globulin, Total: 2.7 g/dL (ref 1.5–4.5)
Glucose: 70 mg/dL (ref 70–99)
Potassium: 4.3 mmol/L (ref 3.5–5.2)
Sodium: 138 mmol/L (ref 134–144)
Total Protein: 6.9 g/dL (ref 6.0–8.5)
eGFR: 105 mL/min/{1.73_m2} (ref >59)

## 2021-08-06 LAB — CBC
Hematocrit: 38.4 % (ref 34.0–46.6)
Hemoglobin: 12.5 g/dL (ref 11.1–15.9)
MCH: 28.7 pg (ref 26.6–33.0)
MCHC: 32.6 g/dL (ref 31.5–35.7)
MCV: 88 fL (ref 79–97)
Platelets: 308 10*3/uL (ref 150–450)
RBC: 4.35 x10E6/uL (ref 3.77–5.28)
RDW: 13.4 % (ref 11.7–15.4)
WBC: 3.2 10*3/uL — ABNORMAL LOW (ref 3.4–10.8)

## 2021-08-06 LAB — LIPID PANEL
Chol/HDL Ratio: 3 ratio (ref 0.0–4.4)
Cholesterol, Total: 191 mg/dL (ref 100–199)
HDL: 63 mg/dL (ref >39)
LDL Chol Calc (NIH): 119 mg/dL — ABNORMAL HIGH (ref 0–99)
Triglycerides: 45 mg/dL (ref 0–149)
VLDL Cholesterol Cal: 9 mg/dL (ref 5–40)

## 2021-08-07 IMAGING — MG MM DIGITAL DIAGNOSTIC UNILAT*R* W/ TOMO W/ CAD
4 series · 4 of 12 positions shown · non-contrast
Comparison: Previous exam(s).

CLINICAL DATA: Patient was called back from screening mammography
due to a right breast mass.

EXAM:
DIGITAL DIAGNOSTIC RIGHT MAMMOGRAM WITH TOMO
ULTRASOUND RIGHT BREAST

[R MLO synth-2D]
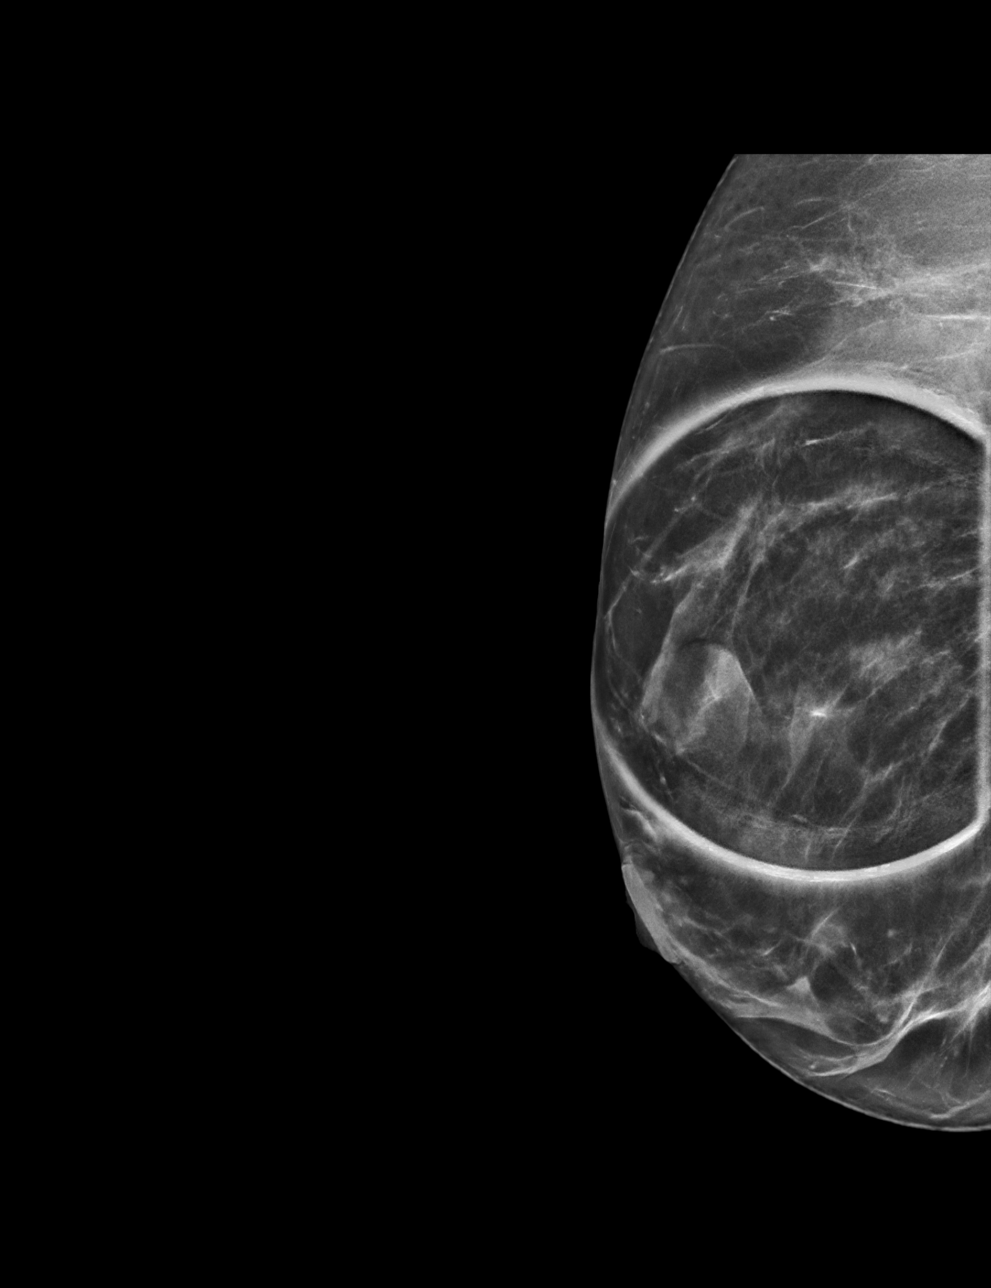

[R CC synth-2D]
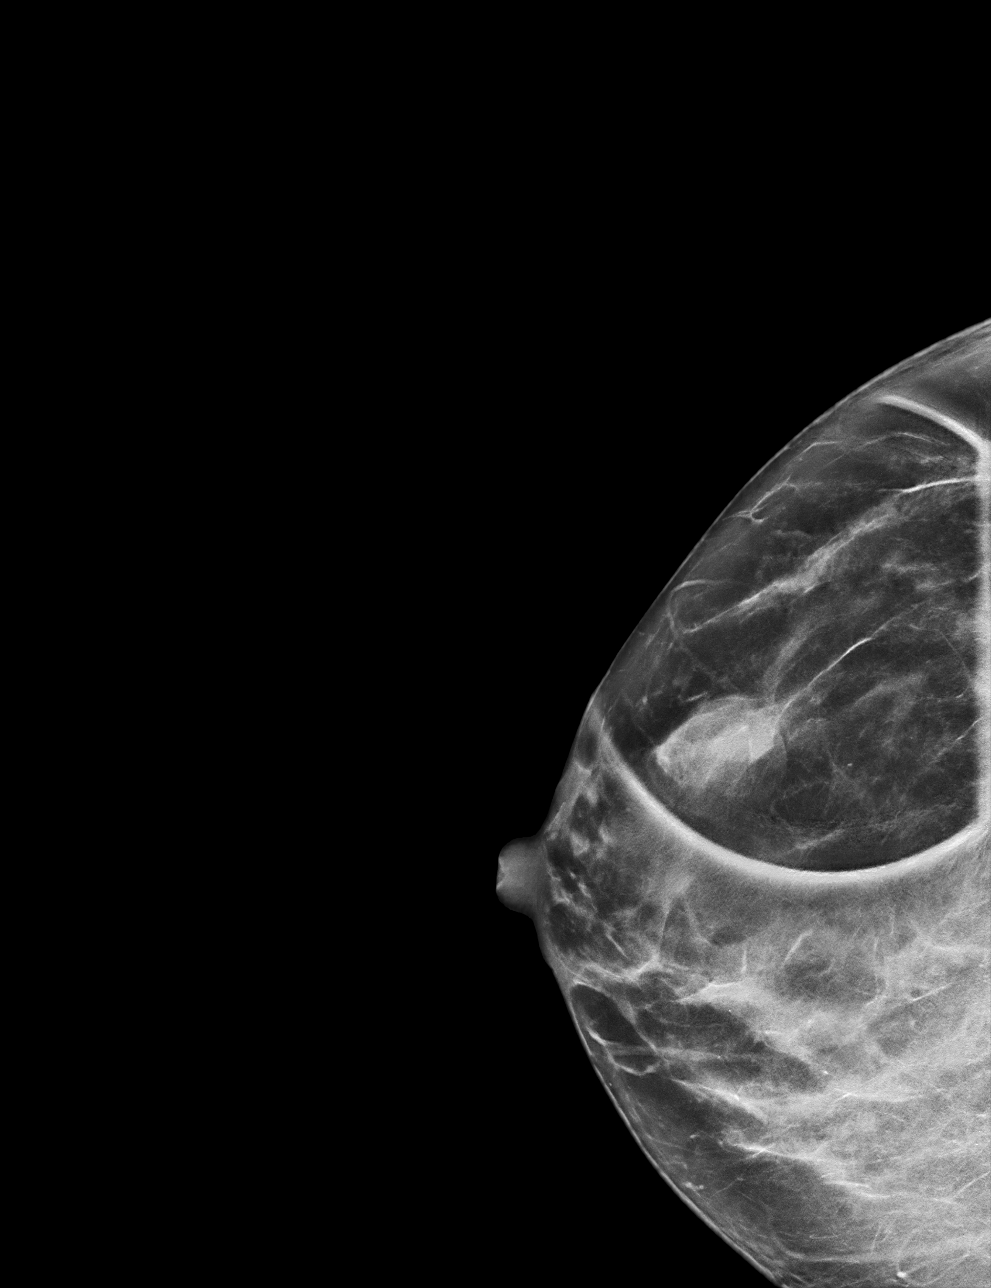

[R CC tomo · tomo slice 31/60.0]
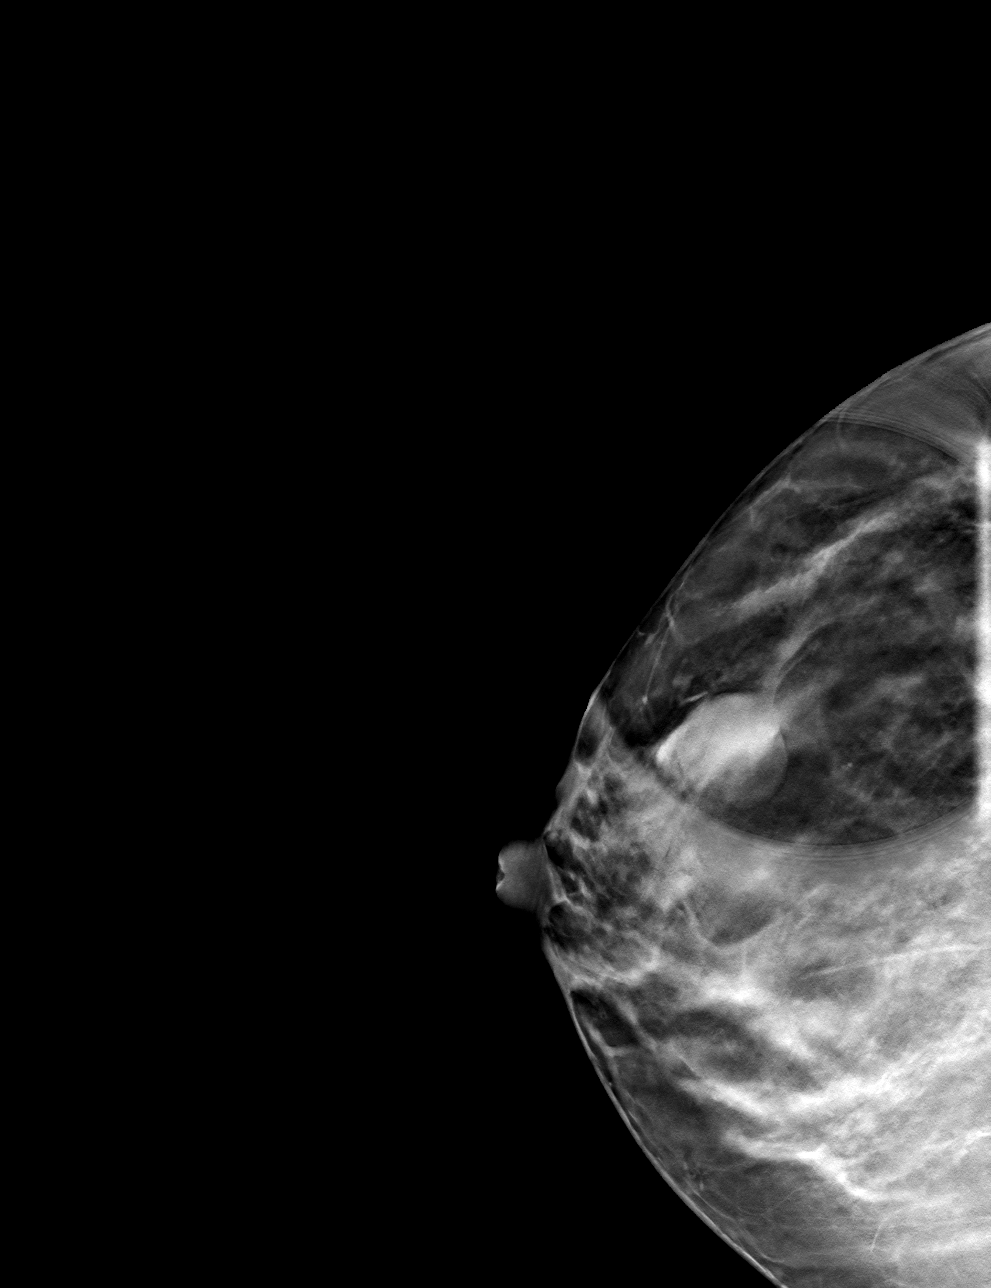

[R MLO tomo · tomo slice 34/67.0]
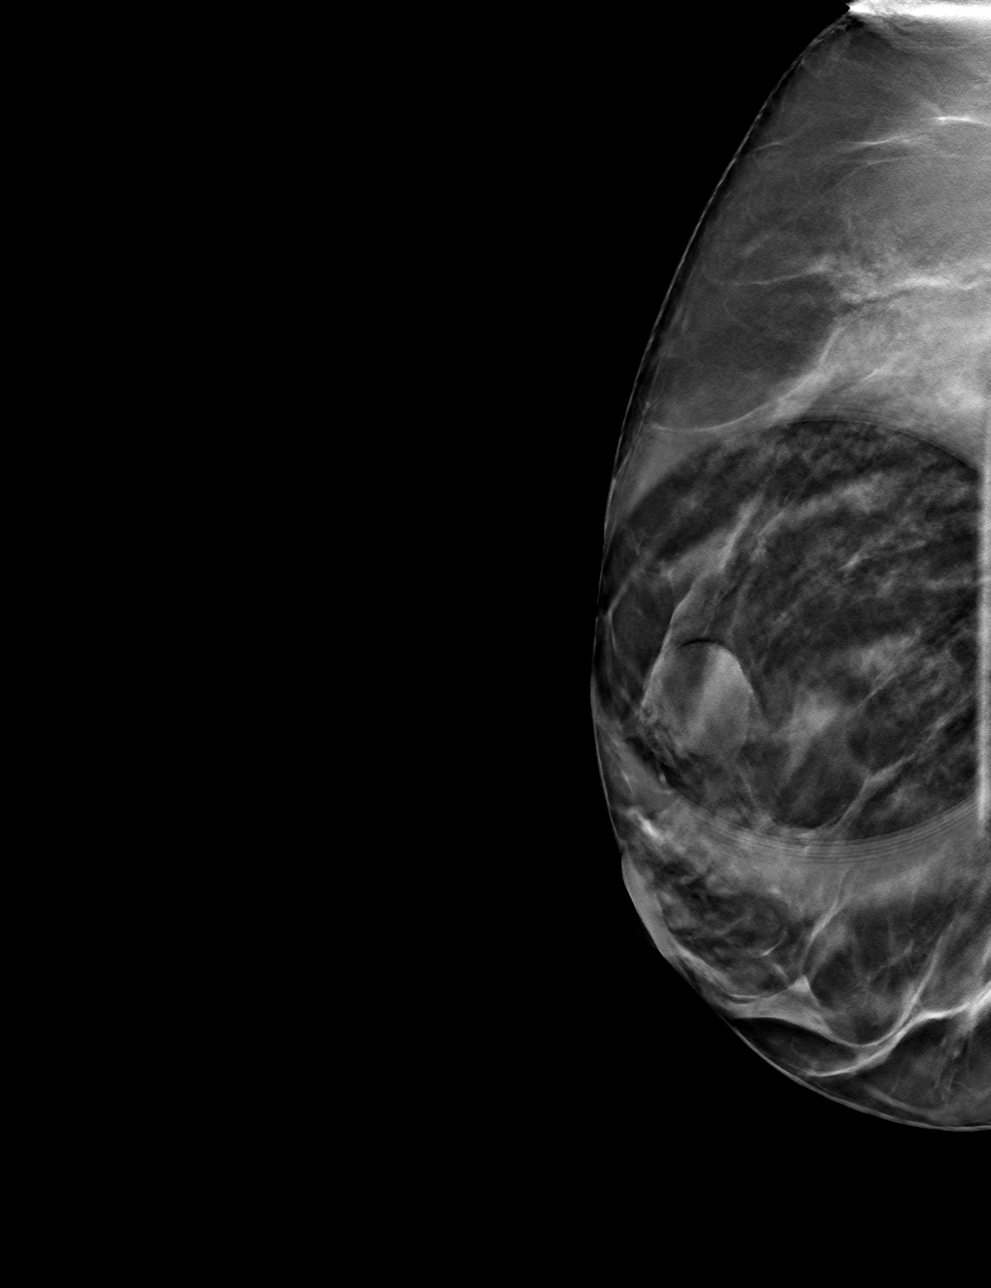

[4 of 12 positions shown; findings below may reference images not displayed]

ACR Breast Density Category c: The breast tissue is heterogeneously
dense, which may obscure small masses.
FINDINGS: Mass in the right breast at 10 o'clock persists on additional
imaging.

On physical exam, no suspicious lumps are identified.

Targeted ultrasound is performed, showing a simple cyst in the right
breast at 10 o'clock correlating with mammographically identified
mass.
IMPRESSION: Fibrocystic changes.  No evidence of malignancy.

RECOMMENDATION:
Annual screening mammography.

I have discussed the findings and recommendations with the patient.
If applicable, a reminder letter will be sent to the patient
regarding the next appointment.

BI-RADS CATEGORY  2: Benign.

## 2021-08-07 IMAGING — US US BREAST*R* LIMITED INC AXILLA
1 series · 5 of 5 positions shown · non-contrast
Comparison: Previous exam(s).

CLINICAL DATA: Patient was called back from screening mammography
due to a right breast mass.

EXAM:
DIGITAL DIAGNOSTIC RIGHT MAMMOGRAM WITH TOMO
ULTRASOUND RIGHT BREAST

[Series 1: us breast*right* limited inc axilla · 0.06mm/px · 5 of 5 slices shown]
[im 1/5]
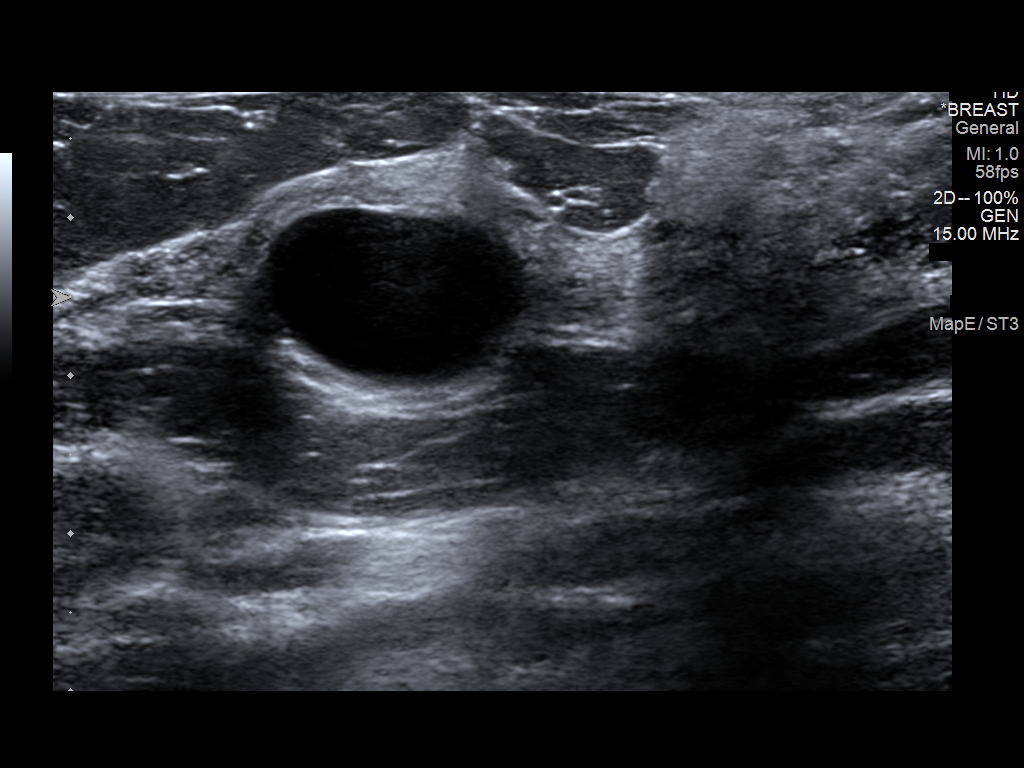
[im 2/5]
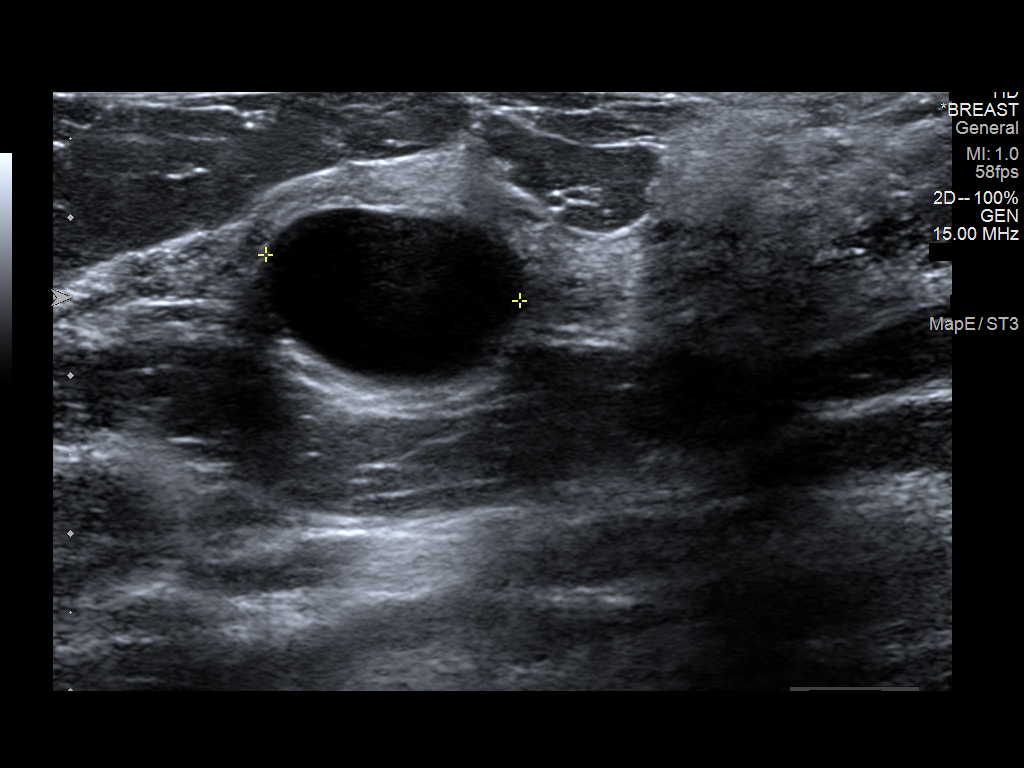
[im 3/5]
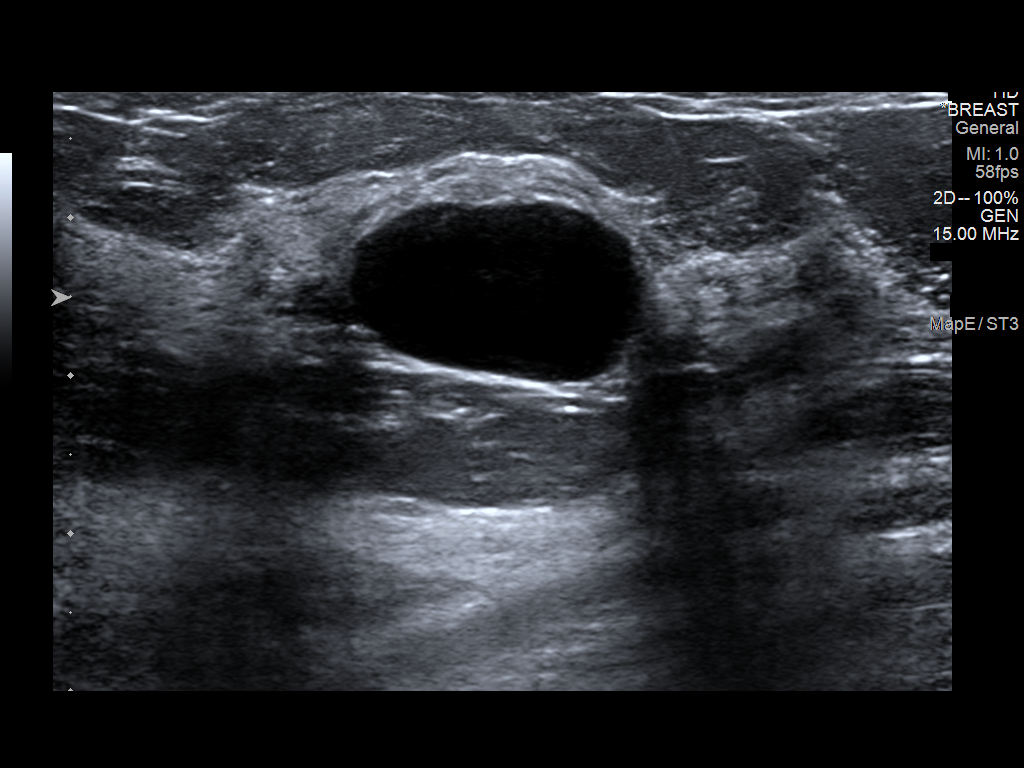
[im 4/5]
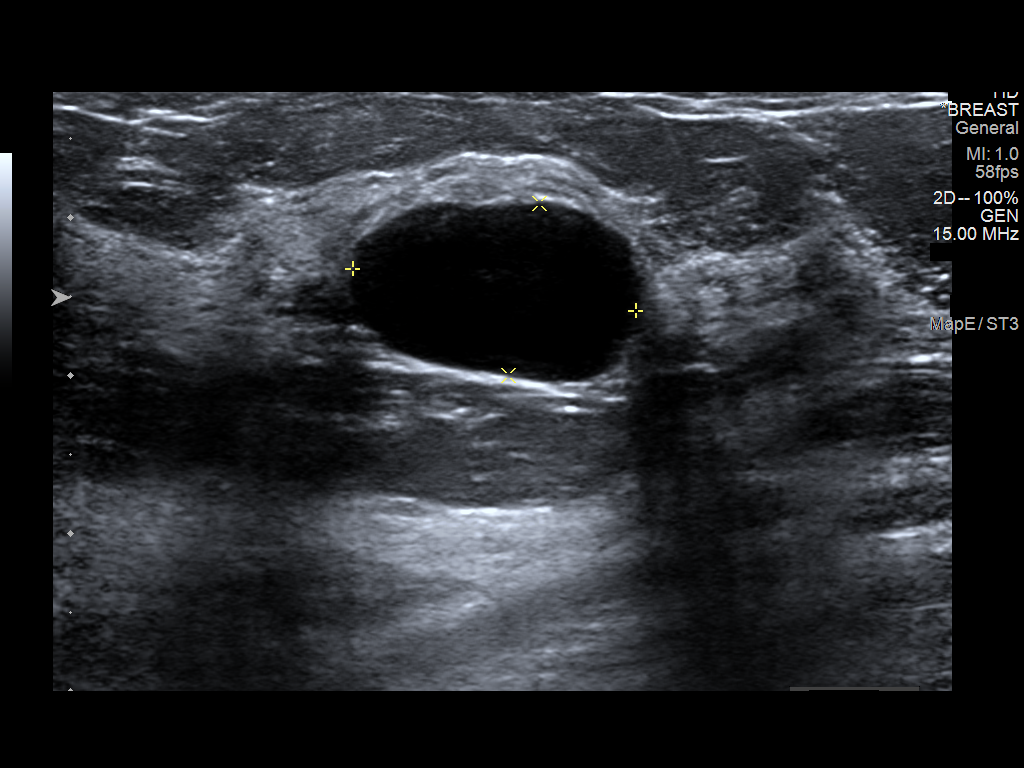
[im 5/5]
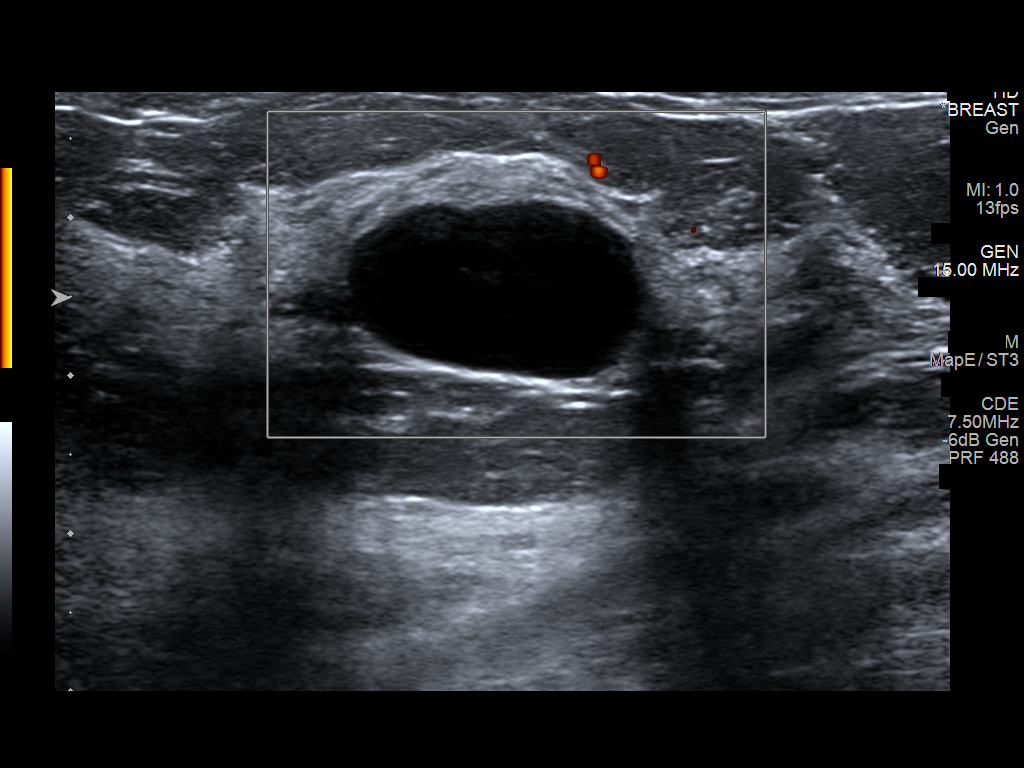

[5 of 5 positions shown; findings below may reference images not displayed]

ACR Breast Density Category c: The breast tissue is heterogeneously
dense, which may obscure small masses.
FINDINGS: Mass in the right breast at 10 o'clock persists on additional
imaging.

On physical exam, no suspicious lumps are identified.

Targeted ultrasound is performed, showing a simple cyst in the right
breast at 10 o'clock correlating with mammographically identified
mass.
IMPRESSION: Fibrocystic changes.  No evidence of malignancy.

RECOMMENDATION:
Annual screening mammography.

I have discussed the findings and recommendations with the patient.
If applicable, a reminder letter will be sent to the patient
regarding the next appointment.

BI-RADS CATEGORY  2: Benign.

## 2021-08-17 ENCOUNTER — Other Ambulatory Visit: Payer: Self-pay | Admitting: Internal Medicine

## 2021-09-14 ENCOUNTER — Other Ambulatory Visit: Payer: Self-pay

## 2021-09-14 ENCOUNTER — Encounter: Payer: Self-pay | Admitting: Internal Medicine

## 2021-09-14 ENCOUNTER — Other Ambulatory Visit (HOSPITAL_COMMUNITY): Payer: Self-pay

## 2021-09-14 MED ORDER — WEGOVY 1.7 MG/0.75ML ~~LOC~~ SOAJ
1.7000 mg | SUBCUTANEOUS | 0 refills | Status: DC
  Filled 2021-09-14: qty 3, 28d supply, fill #0

## 2021-09-17 ENCOUNTER — Other Ambulatory Visit (HOSPITAL_COMMUNITY): Payer: Self-pay

## 2021-10-28 ENCOUNTER — Other Ambulatory Visit (HOSPITAL_COMMUNITY): Payer: Self-pay

## 2021-10-28 ENCOUNTER — Other Ambulatory Visit: Payer: Self-pay | Admitting: Internal Medicine

## 2021-10-28 MED ORDER — WEGOVY 1.7 MG/0.75ML ~~LOC~~ SOAJ
1.7000 mg | SUBCUTANEOUS | 0 refills | Status: DC
  Filled 2021-10-28: qty 3, 28d supply, fill #0

## 2021-11-02 ENCOUNTER — Other Ambulatory Visit: Payer: Self-pay

## 2021-11-02 ENCOUNTER — Other Ambulatory Visit (HOSPITAL_BASED_OUTPATIENT_CLINIC_OR_DEPARTMENT_OTHER): Payer: Self-pay

## 2021-11-02 ENCOUNTER — Other Ambulatory Visit (HOSPITAL_COMMUNITY): Payer: Self-pay

## 2021-11-02 MED ORDER — WEGOVY 1 MG/0.5ML ~~LOC~~ SOAJ
1.0000 mg | SUBCUTANEOUS | 1 refills | Status: DC
  Filled 2021-11-02 (×2): qty 2, 28d supply, fill #0

## 2021-11-11 ENCOUNTER — Encounter: Payer: Self-pay | Admitting: Internal Medicine

## 2021-11-11 ENCOUNTER — Other Ambulatory Visit (HOSPITAL_COMMUNITY): Payer: Self-pay

## 2021-11-11 ENCOUNTER — Telehealth: Payer: PRIVATE HEALTH INSURANCE | Admitting: Internal Medicine

## 2021-11-11 VITALS — Temp 98.5°F | Ht 63.0 in | Wt 161.5 lb

## 2021-11-11 DIAGNOSIS — K5903 Drug induced constipation: Secondary | ICD-10-CM

## 2021-11-11 DIAGNOSIS — Z6828 Body mass index (BMI) 28.0-28.9, adult: Secondary | ICD-10-CM | POA: Diagnosis not present

## 2021-11-11 MED ORDER — WEGOVY 1 MG/0.5ML ~~LOC~~ SOAJ
1.0000 mg | SUBCUTANEOUS | 1 refills | Status: DC
  Filled 2021-11-11 – 2021-11-29 (×3): qty 2, 28d supply, fill #0
  Filled 2021-12-22: qty 2, 28d supply, fill #1

## 2021-11-11 NOTE — Patient Instructions (Signed)

## 2021-11-11 NOTE — Progress Notes (Unsigned)
Virtual Visit via Video   This visit type was conducted due to national recommendations for restrictions regarding the COVID-19 Pandemic (e.g. social distancing) in an effort to limit this patient's exposure and mitigate transmission in our community.  Due to her co-morbid illnesses, this patient is at least at moderate risk for complications without adequate follow up.  This format is felt to be most appropriate for this patient at this time.  All issues noted in this document were discussed and addressed.  A limited physical exam was performed with this format.    This visit type was conducted due to national recommendations for restrictions regarding the COVID-19 Pandemic (e.g. social distancing) in an effort to limit this patient's exposure and mitigate transmission in our community.  Patients identity confirmed using two different identifiers.  This format is felt to be most appropriate for this patient at this time.  All issues noted in this document were discussed and addressed.  No physical exam was performed (except for noted visual exam findings with Video Visits).    Date:  11/11/2021   ID:  Diana Craig, DOB 12-20-1975, MRN [id number]  Patient Location:  Home  Provider location:   Office    Chief Complaint:  "I have weight f/u".   History of Present Illness:    Diana Craig is a 46 y.o. female who presents via video conferencing for a telehealth visit today.    The patient does not have symptoms concerning for COVID-19 infection (fever, chills, cough, or new shortness of breath).   HPI   Past Medical History:  Diagnosis Date   Anemia    Anxiety    Headache(784.0)    menstrual cycle related   Past Surgical History:  Procedure Laterality Date   BUNIONECTOMY     KNEE ARTHROSCOPY Left 07/23/2021   [facility]   MYOMECTOMY N/A 02/14/2013   Procedure: Exploratory Laparotomy MYOMECTOMY;  Surgeon: Marvene Staff, MD;  Location: [facility];  Service:  Gynecology;  Laterality: N/A;  2 1/2 hrs.   TOE SURGERY       Current Meds  Medication Sig   Chlorphen-PE-Acetaminophen (NOREL AD) 4-10-325 MG TABS One tab po bid prn   [employer] ASPIRIN LOW DOSE 81 MG tablet SMARTSIG:1 Tablet(s) By Mouth Morning-Night   DAYVIGO 5 MG TABS TAKE 1 TABLET BY MOUTH EVERY DAY AT BEDTIME AS NEEDED   ibuprofen (ADVIL,MOTRIN) 800 MG tablet Take 1 tablet (800 mg total) by mouth every 6 (six) hours as needed.   Multiple Vitamins-Minerals (MULTIVITAMIN ADULT PO) Take by mouth.   [DISCONTINUED] Semaglutide-Weight Management (WEGOVY) 1 MG/0.5ML SOAJ Inject 1 mg into the skin once a week.     Allergies:   Patient has no known allergies.   Social History   Tobacco Use   Smoking status: Never   Smokeless tobacco: Never   Tobacco comments:    n/a  Vaping Use   Vaping Use: Never used  Substance Use Topics   Alcohol use: Yes    Comment: social   Drug use: No     Family Hx: The patient's family history includes COPD in her mother; Hypertension in her mother; Other in her father. There is no history of Breast cancer.  ROS:   Please see the history of present illness.    ROS  All other systems reviewed and are negative.   Labs/Other Tests and Data Reviewed:    Recent Labs: 08/05/2021: ALT 10; BUN 14; Creatinine, Ser 0.72; Hemoglobin 12.5; Platelets 308; Potassium  4.3; Sodium 138   Recent Lipid Panel Lab Results  Component Value Date/Time   CHOL 191 08/05/2021 09:52 AM   TRIG 45 08/05/2021 09:52 AM   HDL 63 08/05/2021 09:52 AM   CHOLHDL 3.0 08/05/2021 09:52 AM   LDLCALC 119 (H) 08/05/2021 09:52 AM    Wt Readings from Last 3 Encounters:  11/11/21 161 lb 8 oz (73.3 kg)  08/05/21 154 lb 6.4 oz (70 kg)  06/23/21 158 lb 3.2 oz (71.8 kg)     Exam:    Vital Signs:  Temp 98.5 F (36.9 C)   Ht '5\' 3"'$  (1.6 m)   Wt 161 lb 8 oz (73.3 kg)   BMI 28.61 kg/m     Physical Exam Vitals and nursing note reviewed.  HENT:     Head: Normocephalic and atraumatic.   Eyes:     Extraocular Movements: Extraocular movements intact.  Pulmonary:     Effort: Pulmonary effort is normal.  Musculoskeletal:     Cervical back: Normal range of motion.  Neurological:     Mental Status: She is alert and oriented to person, place, and time.  Psychiatric:        Mood and Affect: Affect normal.    ASSESSMENT & PLAN:    1. BMI 28.0-28.9,adult  2. Drug-induced constipation  COVID-19 Education: The signs and symptoms of COVID-19 were discussed with the patient and how to seek care for testing (follow up with PCP or arrange E-visit).  The importance of social distancing was discussed today.  Patient Risk:   After full review of this patients clinical status, I feel that they are at least moderate risk at this time.  Time:   Today, I have spent 15 minutes/ seconds with the patient with telehealth technology discussing above diagnoses.     Medication Adjustments/Labs and Tests Ordered: Current medicines are reviewed at length with the patient today.  Concerns regarding medicines are outlined above.   Tests Ordered: No orders of the defined types were placed in this encounter.   Medication Changes: Meds ordered this encounter  Medications   Semaglutide-Weight Management (WEGOVY) 1 MG/0.5ML SOAJ    Sig: Inject 1 mg into the skin once a week.    Dispense:  2 mL    Refill:  1    Disposition:  Follow up {follow up:15908}  Signed, Maximino Greenland, MD

## 2021-11-12 ENCOUNTER — Other Ambulatory Visit: Payer: Self-pay | Admitting: Obstetrics and Gynecology

## 2021-11-12 ENCOUNTER — Ambulatory Visit
Admission: RE | Admit: 2021-11-12 | Discharge: 2021-11-12 | Disposition: A | Discharge status: Home / Self Care | Payer: PRIVATE HEALTH INSURANCE | Source: Ambulatory Visit | Attending: Obstetrics and Gynecology | Admitting: Obstetrics and Gynecology

## 2021-11-12 DIAGNOSIS — N6320 Unspecified lump in the left breast, unspecified quadrant: Secondary | ICD-10-CM

## 2021-11-18 ENCOUNTER — Other Ambulatory Visit: Payer: Self-pay | Admitting: Internal Medicine

## 2021-11-22 ENCOUNTER — Other Ambulatory Visit (HOSPITAL_COMMUNITY): Payer: Self-pay

## 2021-11-23 ENCOUNTER — Other Ambulatory Visit (HOSPITAL_COMMUNITY): Payer: Self-pay

## 2021-11-29 ENCOUNTER — Other Ambulatory Visit (HOSPITAL_BASED_OUTPATIENT_CLINIC_OR_DEPARTMENT_OTHER): Payer: Self-pay

## 2021-11-30 ENCOUNTER — Other Ambulatory Visit (HOSPITAL_COMMUNITY): Payer: Self-pay

## 2021-12-13 ENCOUNTER — Other Ambulatory Visit: Payer: Self-pay | Admitting: Obstetrics and Gynecology

## 2021-12-23 ENCOUNTER — Other Ambulatory Visit (HOSPITAL_BASED_OUTPATIENT_CLINIC_OR_DEPARTMENT_OTHER): Payer: Self-pay

## 2022-01-04 ENCOUNTER — Encounter (HOSPITAL_BASED_OUTPATIENT_CLINIC_OR_DEPARTMENT_OTHER): Payer: Self-pay | Admitting: Obstetrics and Gynecology

## 2022-01-05 ENCOUNTER — Encounter (HOSPITAL_BASED_OUTPATIENT_CLINIC_OR_DEPARTMENT_OTHER): Payer: Self-pay | Admitting: Obstetrics and Gynecology

## 2022-01-05 NOTE — Progress Notes (Signed)
Spoke w/ via phone for pre-op interview--- pt Lab needs dos---- cbc, urine preg              Lab results------ no COVID test -----patient states asymptomatic no test needed Arrive at ------- 0915 on 01-07-2022 NPO after MN NO Solid Food.  Clear liquids from MN until--- 0815 Med rec completed Medications to take morning of surgery ----- none Diabetic medication ----- n/a Patient instructed no nail polish to be worn day of surgery Patient instructed to bring photo id and insurance card day of surgery Patient aware to have Driver (ride ) / caregiver for 24 hours after surgery -- spouse, antonio Patient Special Instructions ----- pt does  wegony injection for wt loss on Sunday's, last dose 01-02-2022 (pt stated office had given instructions to not do dose wk prior to surgery) Pre-Op special Istructions ----- n/a Patient verbalized understanding of instructions that were given at this phone interview. Patient denies shortness of breath, chest pain, fever, cough at this phone interview.

## 2022-01-06 NOTE — Anesthesia Preprocedure Evaluation (Addendum)
Anesthesia Evaluation  Patient identified by MRN, date of birth, ID band Patient awake    Reviewed: Allergy & Precautions, NPO status , Patient's Chart, lab work & pertinent test results  Airway Mallampati: II  TM Distance: >3 FB Neck ROM: Full    Dental no notable dental hx. (+) Teeth Intact, Dental Advisory Given   Pulmonary neg pulmonary ROS   Pulmonary exam normal breath sounds clear to auscultation       Cardiovascular Exercise Tolerance: Good Normal cardiovascular exam Rhythm:Regular Rate:Normal     Neuro/Psych  Headaches    GI/Hepatic ,GERD  ,,  Endo/Other    Renal/GU      Musculoskeletal   Abdominal   Peds  Hematology Lab Results      Component                Value               Date                      WBC                      3.0 (L)             01/07/2022                HGB                      12.2                01/07/2022                HCT                      37.6                01/07/2022                MCV                      92.2                01/07/2022                PLT                      239                 01/07/2022              Anesthesia Other Findings   Reproductive/Obstetrics negative OB ROS                             Anesthesia Physical Anesthesia Plan  ASA: 1  Anesthesia Plan: General   Post-op Pain Management: Precedex, Tylenol PO (pre-op)* and Toradol IV (intra-op)*   Induction: Intravenous  PONV Risk Score and Plan: 3 and Midazolam, Ondansetron and Dexamethasone  Airway Management Planned: LMA  Additional Equipment: None  Intra-op Plan:   Post-operative Plan:   Informed Consent: I have reviewed the patients History and Physical, chart, labs and discussed the procedure including the risks, benefits and alternatives for the proposed anesthesia with the patient or authorized representative who has indicated his/her understanding and  acceptance.     Dental advisory given  Plan Discussed with:   Anesthesia Plan  Comments:         Anesthesia Quick Evaluation

## 2022-01-07 ENCOUNTER — Ambulatory Visit (HOSPITAL_BASED_OUTPATIENT_CLINIC_OR_DEPARTMENT_OTHER): Payer: PRIVATE HEALTH INSURANCE | Admitting: Anesthesiology

## 2022-01-07 ENCOUNTER — Encounter (HOSPITAL_BASED_OUTPATIENT_CLINIC_OR_DEPARTMENT_OTHER): Payer: Self-pay | Admitting: Obstetrics and Gynecology

## 2022-01-07 ENCOUNTER — Other Ambulatory Visit: Payer: Self-pay

## 2022-01-07 ENCOUNTER — Ambulatory Visit (HOSPITAL_BASED_OUTPATIENT_CLINIC_OR_DEPARTMENT_OTHER)
Admission: RE | Admit: 2022-01-07 | Discharge: 2022-01-07 | Disposition: A | Discharge status: Home / Self Care | Payer: PRIVATE HEALTH INSURANCE | Attending: Obstetrics and Gynecology | Admitting: Obstetrics and Gynecology

## 2022-01-07 ENCOUNTER — Encounter (HOSPITAL_BASED_OUTPATIENT_CLINIC_OR_DEPARTMENT_OTHER)
Admission: RE | Disposition: A | Discharge status: Home / Self Care | Payer: Self-pay | Source: Home / Self Care | Attending: Obstetrics and Gynecology

## 2022-01-07 DIAGNOSIS — D251 Intramural leiomyoma of uterus: Secondary | ICD-10-CM | POA: Insufficient documentation

## 2022-01-07 DIAGNOSIS — D259 Leiomyoma of uterus, unspecified: Secondary | ICD-10-CM

## 2022-01-07 DIAGNOSIS — D252 Subserosal leiomyoma of uterus: Secondary | ICD-10-CM | POA: Diagnosis present

## 2022-01-07 DIAGNOSIS — R102 Pelvic and perineal pain: Secondary | ICD-10-CM | POA: Insufficient documentation

## 2022-01-07 DIAGNOSIS — N920 Excessive and frequent menstruation with regular cycle: Secondary | ICD-10-CM | POA: Diagnosis not present

## 2022-01-07 DIAGNOSIS — Z01818 Encounter for other preprocedural examination: Secondary | ICD-10-CM

## 2022-01-07 HISTORY — DX: Iron deficiency anemia, unspecified: D50.9

## 2022-01-07 HISTORY — DX: Gastro-esophageal reflux disease without esophagitis: K21.9

## 2022-01-07 HISTORY — PX: DILATATION & CURETTAGE/HYSTEROSCOPY WITH MYOSURE: SHX6511

## 2022-01-07 HISTORY — DX: Leiomyoma of uterus, unspecified: D25.9

## 2022-01-07 LAB — CBC
HCT: 37.6 % (ref 36.0–46.0)
Hemoglobin: 12.2 g/dL (ref 12.0–15.0)
MCH: 29.9 pg (ref 26.0–34.0)
MCHC: 32.4 g/dL (ref 30.0–36.0)
MCV: 92.2 fL (ref 80.0–100.0)
Platelets: 239 10*3/uL (ref 150–400)
RBC: 4.08 MIL/uL (ref 3.87–5.11)
RDW: 13.2 % (ref 11.5–15.5)
WBC: 3 10*3/uL — ABNORMAL LOW (ref 4.0–10.5)
nRBC: 0 % (ref 0.0–0.2)

## 2022-01-07 LAB — POCT PREGNANCY, URINE: Preg Test, Ur: NEGATIVE

## 2022-01-07 SURGERY — RADIOFREQUENCY ABLATION, LEIOMYOMA, UTERUS, TRANSCERVICAL APPROACH, WITH US GUIDANCE
Anesthesia: General | Site: Uterus

## 2022-01-07 MED ORDER — OXYCODONE HCL 5 MG PO TABS: 5.0000 mg | ORAL_TABLET | Freq: Once | ORAL | Status: DC | PRN

## 2022-01-07 MED ORDER — FENTANYL CITRATE (PF) 100 MCG/2ML IJ SOLN
INTRAMUSCULAR | Status: DC | PRN
  Administered 2022-01-07: 25 ug via INTRAVENOUS
  Administered 2022-01-07: 50 ug via INTRAVENOUS
  Administered 2022-01-07: 25 ug via INTRAVENOUS

## 2022-01-07 MED ORDER — STERILE WATER FOR IRRIGATION IR SOLN
Status: DC | PRN
  Administered 2022-01-07: 500 mL

## 2022-01-07 MED ORDER — HYDROMORPHONE HCL 1 MG/ML IJ SOLN
INTRAMUSCULAR | Status: AC
  Filled 2022-01-07: qty 1

## 2022-01-07 MED ORDER — KETOROLAC TROMETHAMINE 30 MG/ML IJ SOLN
INTRAMUSCULAR | Status: AC
  Filled 2022-01-07: qty 1

## 2022-01-07 MED ORDER — PHENYLEPHRINE 80 MCG/ML (10ML) SYRINGE FOR IV PUSH (FOR BLOOD PRESSURE SUPPORT)
PREFILLED_SYRINGE | INTRAVENOUS | Status: DC | PRN
  Administered 2022-01-07: 80 ug via INTRAVENOUS

## 2022-01-07 MED ORDER — DEXMEDETOMIDINE HCL IN NACL 200 MCG/50ML IV SOLN
INTRAVENOUS | Status: DC | PRN
  Administered 2022-01-07: 12 ug via INTRAVENOUS

## 2022-01-07 MED ORDER — DEXAMETHASONE SODIUM PHOSPHATE 10 MG/ML IJ SOLN
INTRAMUSCULAR | Status: DC | PRN
  Administered 2022-01-07: 10 mg via INTRAVENOUS

## 2022-01-07 MED ORDER — POVIDONE-IODINE 10 % EX SWAB: 2.0000 | Freq: Once | CUTANEOUS | Status: DC

## 2022-01-07 MED ORDER — KETOROLAC TROMETHAMINE 30 MG/ML IJ SOLN
INTRAMUSCULAR | Status: DC | PRN
  Administered 2022-01-07: 30 mg via INTRAVENOUS

## 2022-01-07 MED ORDER — KETOROLAC TROMETHAMINE 30 MG/ML IJ SOLN: 30.0000 mg | Freq: Once | INTRAMUSCULAR | Status: DC | PRN

## 2022-01-07 MED ORDER — ONDANSETRON HCL 4 MG/2ML IJ SOLN
INTRAMUSCULAR | Status: DC | PRN
  Administered 2022-01-07: 4 mg via INTRAVENOUS

## 2022-01-07 MED ORDER — HYDROMORPHONE HCL 1 MG/ML IJ SOLN
0.2500 mg | INTRAMUSCULAR | Status: DC | PRN
  Administered 2022-01-07 (×4): 0.5 mg via INTRAVENOUS

## 2022-01-07 MED ORDER — SODIUM CHLORIDE 0.9 % IR SOLN
Status: DC | PRN
  Administered 2022-01-07: 3000 mL

## 2022-01-07 MED ORDER — MIDAZOLAM HCL 2 MG/2ML IJ SOLN
INTRAMUSCULAR | Status: AC
  Filled 2022-01-07: qty 2

## 2022-01-07 MED ORDER — MIDAZOLAM HCL 2 MG/2ML IJ SOLN
INTRAMUSCULAR | Status: DC | PRN
  Administered 2022-01-07: 2 mg via INTRAVENOUS

## 2022-01-07 MED ORDER — DEXAMETHASONE SODIUM PHOSPHATE 10 MG/ML IJ SOLN
INTRAMUSCULAR | Status: AC
  Filled 2022-01-07: qty 1

## 2022-01-07 MED ORDER — ONDANSETRON HCL 4 MG/2ML IJ SOLN: 4.0000 mg | Freq: Once | INTRAMUSCULAR | Status: DC | PRN

## 2022-01-07 MED ORDER — LIDOCAINE 2% (20 MG/ML) 5 ML SYRINGE
INTRAMUSCULAR | Status: DC | PRN
  Administered 2022-01-07: 60 mg via INTRAVENOUS

## 2022-01-07 MED ORDER — LIDOCAINE HCL (PF) 2 % IJ SOLN
INTRAMUSCULAR | Status: AC
  Filled 2022-01-07: qty 5

## 2022-01-07 MED ORDER — OXYCODONE HCL 5 MG/5ML PO SOLN: 5.0000 mg | Freq: Once | ORAL | Status: DC | PRN

## 2022-01-07 MED ORDER — LACTATED RINGERS IV SOLN: INTRAVENOUS | Status: DC

## 2022-01-07 MED ORDER — ONDANSETRON HCL 4 MG/2ML IJ SOLN
INTRAMUSCULAR | Status: AC
  Filled 2022-01-07: qty 2

## 2022-01-07 MED ORDER — PHENYLEPHRINE 80 MCG/ML (10ML) SYRINGE FOR IV PUSH (FOR BLOOD PRESSURE SUPPORT)
PREFILLED_SYRINGE | INTRAVENOUS | Status: AC
  Filled 2022-01-07: qty 10

## 2022-01-07 MED ORDER — FENTANYL CITRATE (PF) 100 MCG/2ML IJ SOLN
INTRAMUSCULAR | Status: AC
  Filled 2022-01-07: qty 2

## 2022-01-07 MED ORDER — PROPOFOL 10 MG/ML IV BOLUS
INTRAVENOUS | Status: DC | PRN
  Administered 2022-01-07: 20 mg via INTRAVENOUS
  Administered 2022-01-07: 180 mg via INTRAVENOUS

## 2022-01-07 MED ORDER — PROPOFOL 10 MG/ML IV BOLUS
INTRAVENOUS | Status: AC
  Filled 2022-01-07: qty 20

## 2022-01-07 SURGICAL SUPPLY — 21 items
DEVICE MYOSURE LITE (MISCELLANEOUS) IMPLANT
ELECT DISPERSIVE SONATA (ELECTRODE) ×2 IMPLANT
GLOVE BIOGEL PI IND STRL 6.5 (GLOVE) IMPLANT
GLOVE BIOGEL PI IND STRL 7.0 (GLOVE) ×1 IMPLANT
GLOVE BIOGEL PI IND STRL 7.5 (GLOVE) IMPLANT
GLOVE ECLIPSE 6.5 STRL STRAW (GLOVE) ×1 IMPLANT
GOWN STRL REUS W/TWL LRG LVL3 (GOWN DISPOSABLE) ×1 IMPLANT
HANDPIECE RFA SONATA (MISCELLANEOUS) ×1 IMPLANT
IV NS 1000ML (IV SOLUTION)
IV NS 1000ML BAXH (IV SOLUTION) IMPLANT
IV NS IRRIG 3000ML ARTHROMATIC (IV SOLUTION) ×1 IMPLANT
KIT PROCEDURE FLUENT (KITS) ×1 IMPLANT
KIT TURNOVER CYSTO (KITS) ×1 IMPLANT
PACK VAGINAL MINOR WOMEN LF (CUSTOM PROCEDURE TRAY) ×1 IMPLANT
PAD OB MATERNITY 4.3X12.25 (PERSONAL CARE ITEMS) ×1 IMPLANT
PAD PREP 24X48 CUFFED NSTRL (MISCELLANEOUS) ×1 IMPLANT
SEAL CERVICAL OMNI LOK (ABLATOR) IMPLANT
SEAL ROD LENS SCOPE MYOSURE (ABLATOR) ×1 IMPLANT
SYR 50ML LL SCALE MARK (SYRINGE) ×1 IMPLANT
TOWEL OR 17X26 10 PK STRL BLUE (TOWEL DISPOSABLE) ×1 IMPLANT
WATER STERILE IRR 500ML POUR (IV SOLUTION) IMPLANT

## 2022-01-07 NOTE — Interval H&P Note (Signed)
History and Physical Interval Note:  01/07/2022 11:13 AM  Diana Craig  has presented today for surgery, with the diagnosis of uterine fibroids.  The various methods of treatment have been discussed with the patient and family. After consideration of risks, benefits and other options for treatment, the patient has consented to  PROCEDURES: [place], DIAGNOSTIC HYSTEROSCOPY, RESECTION USING [place] as a surgical intervention.  The patient's history has been reviewed, patient examined, no change in status, stable for surgery.  I have reviewed the patient's chart and labs.  Questions were answered to the patient's satisfaction.     [name]

## 2022-01-07 NOTE — Transfer of Care (Signed)
Immediate Anesthesia Transfer of Care Note  Patient: Diana Craig  Procedure(s) Performed: Procedure(s) (LRB): Radio Frequency Ablation with Sonata (N/A) DILATATION & CURETTAGE/HYSTEROSCOPY WITH MYOSURE (N/A)  Patient Location: PACU  Anesthesia Type: General  Level of Consciousness: awake, oriented, sedated and patient cooperative  Airway & Oxygen Therapy: Patient Spontanous Breathing and Patient connected to face mask oxygen  Post-op Assessment: Report given to PACU RN and Post -op Vital signs reviewed and stable  Post vital signs: Reviewed and stable  Complications: No apparent anesthesia complications Last Vitals:  Vitals Value Taken Time  BP 130/89 01/07/22 1422  Temp    Pulse 67 01/07/22 1424  Resp 16 01/07/22 1424  SpO2 100 % 01/07/22 1424  Vitals shown include unvalidated device data.  Last Pain:  Vitals:   01/07/22 0917  TempSrc: Oral  PainSc: 1       Patients Stated Pain Goal: 7 (50/35/46 5681)  Complications: No notable events documented.

## 2022-01-07 NOTE — H&P (Signed)
Diana Craig is an 46 y.o. female. BF presents for surgical management of symptomatic uterine fibroid  Pertinent Gynecological History: Menses: flow is moderate Bleeding: dub Contraception: none DES exposure: denies Blood transfusions: none Sexually transmitted diseases: no past history Previous GYN Procedures:  myomectomy   Last mammogram: normal Date: 2022 Last pap: normal Date: 2023 OB History: G0, P0   Menstrual History: Menarche age: n/a Patient's last menstrual period was 01/03/2022 (exact date).    Past Medical History:  Diagnosis Date   Anxiety    GERD (gastroesophageal reflux disease)    Headache(784.0)    menstrual cycle related   IDA (iron deficiency anemia)    Uterine fibroid     Past Surgical History:  Procedure Laterality Date   BUNIONECTOMY Bilateral    right 2008  and left 2010   DILATION AND CURETTAGE OF UTERUS     x2   last one 2014   KNEE ARTHROSCOPY W/ MENISCAL REPAIR Left 07/23/2021   [facility]   MYOMECTOMY N/A 02/14/2013   Procedure: Exploratory Laparotomy MYOMECTOMY;  Surgeon: Marvene Staff, MD;  Location: [facility];  Service: Gynecology;  Laterality: N/A;  2 1/2 hrs.    Family History  Problem Relation Age of Onset   COPD Mother    Hypertension Mother    Other Father        health status unknown   Breast cancer Neg Hx     Social History:  reports that she has never smoked. She has never used smokeless tobacco. She reports current alcohol use. She reports that she does not use drugs.  Allergies: No Known Allergies  Medications Prior to Admission  Medication Sig Dispense Refill Last Dose   acetaminophen (TYLENOL) 500 MG tablet Take 500 mg by mouth every 6 (six) hours as needed.   Past Week   Cholecalciferol (VITAMIN D3) 50 MCG (2000 UT) TABS Take by mouth daily.   01/06/2022   DAYVIGO 5 MG TABS TAKE 1 TABLET BY MOUTH EVERY DAY AT BEDTIME AS NEEDED (Patient taking differently: Take 1 tablet by mouth at bedtime  as needed.) 30 tablet 2 01/05/2022   docusate sodium (COLACE) 100 MG capsule Take 100 mg by mouth daily as needed for mild constipation.   01/05/2022   Ferrous Sulfate (IRON PO) Take 16 mLs by mouth daily.   01/05/2022   ibuprofen (ADVIL) 200 MG tablet Take 400 mg by mouth every 6 (six) hours as needed.   Past Week   misoprostol (CYTOTEC) 200 MCG tablet Place 200 mcg vaginally once.   01/07/2022 at 0900   Multiple Vitamins-Minerals (MULTIVITAMIN ADULT PO) Take by mouth 2 (two) times daily.   01/06/2022   Semaglutide-Weight Management (WEGOVY) 1 MG/0.5ML SOAJ Inject 1 mg into the skin once a week. (Patient taking differently: Inject 1 mg into the skin once a week. Sunday's) 2 mL 1 12/26/2021    Review of Systems  All other systems reviewed and are negative.   Blood pressure 117/74, pulse 62, temperature 97.7 F (36.5 C), temperature source Oral, resp. rate 17, height 5' 3.5" (1.613 m), weight 75.2 kg, last menstrual period 01/03/2022, SpO2 100 %. Physical Exam Constitutional:      Appearance: Normal appearance.  HENT:     Head: Atraumatic.  Eyes:     Extraocular Movements: Extraocular movements intact.  Cardiovascular:     Rate and Rhythm: Regular rhythm.     Heart sounds: Normal heart sounds.  Pulmonary:     Breath sounds: Normal breath  sounds.  Genitourinary:    General: Normal vulva.     Comments: Vagina nl lesion. Scant brown Cervix  closed  Uterus irreg 10 wk size Adnexa nl Musculoskeletal:        General: Normal range of motion.     Cervical back: Neck supple.  Skin:    General: Skin is warm and dry.  Neurological:     General: No focal deficit present.     Mental Status: She is alert and oriented to person, place, and time.  Psychiatric:        Mood and Affect: Mood normal.        Behavior: Behavior normal.     Results for orders placed or performed during the hospital encounter of 01/07/22 (from the past 24 hour(s))  Pregnancy, urine POC     Status: None    Collection Time: 01/07/22  9:02 AM  Result Value Ref Range   Preg Test, Ur NEGATIVE NEGATIVE  CBC     Status: Abnormal   Collection Time: 01/07/22  9:30 AM  Result Value Ref Range   WBC 3.0 (L) 4.0 - 10.5 K/uL   RBC 4.08 3.87 - 5.11 MIL/uL   Hemoglobin 12.2 12.0 - 15.0 g/dL   HCT 37.6 36.0 - 46.0 %   MCV 92.2 80.0 - 100.0 fL   MCH 29.9 26.0 - 34.0 pg   MCHC 32.4 30.0 - 36.0 g/dL   RDW 13.2 11.5 - 15.5 %   Platelets 239 150 - 400 K/uL   nRBC 0.0 0.0 - 0.2 %    No results found.  Assessment/Plan: Symptomatic uterine fibroids  P) Radiofrequency transcervical ablation of uterine fibroid using Sonata , dx hysteroscopy, D&C Risk of surgery reviewed and disc with pt  Diana Craig A Jaquavion Mccannon 01/07/2022, 10:51 AM

## 2022-01-07 NOTE — Discharge Instructions (Addendum)
CALL  IF TEMP>100.4, NOTHING PER VAGINA X 2 WK, CALL IF SOAKING A MAXI  PAD EVERY HOUR OR MORE FREQUENTLY       Post Anesthesia Home Care Instructions  Activity: Get plenty of rest for the remainder of the day. A responsible individual must stay with you for 24 hours following the procedure.  For the next 24 hours, DO NOT: -Drive a car -Paediatric nurse -Drink alcoholic beverages -Take any medication unless instructed by your physician -Make any legal decisions or sign important papers.  Meals: Start with liquid foods such as gelatin or soup. Progress to regular foods as tolerated. Avoid greasy, spicy, heavy foods. If nausea and/or vomiting occur, drink only clear liquids until the nausea and/or vomiting subsides. Call your physician if vomiting continues.  Special Instructions/Symptoms: Your throat may feel dry or sore from the anesthesia or the breathing tube placed in your throat during surgery. If this causes discomfort, gargle with warm salt water. The discomfort should disappear within 24 hours.     No ibuprofen, Advil, Aleve, Motrin, ketorolac, meloxicam, naproxen, or other NSAIDS until after 8:00 pm today if needed.

## 2022-01-07 NOTE — Anesthesia Procedure Notes (Signed)
Procedure Name: LMA Insertion Date/Time: 01/07/2022 11:19 AM  Performed by: Rogers Blocker, CRNAPre-anesthesia Checklist: Patient identified, Emergency Drugs available, Suction available and Patient being monitored Patient Re-evaluated:Patient Re-evaluated prior to induction Oxygen Delivery Method: Circle System Utilized Preoxygenation: Pre-oxygenation with 100% oxygen Induction Type: IV induction Ventilation: Mask ventilation without difficulty LMA: LMA inserted LMA Size: 4.0 Number of attempts: 1 Placement Confirmation: positive ETCO2 Tube secured with: Tape Dental Injury: Teeth and Oropharynx as per pre-operative assessment

## 2022-01-07 NOTE — Brief Op Note (Signed)
01/07/2022  2:15 PM  PATIENT:  Diana Craig  46 y.o. female  PRE-OPERATIVE DIAGNOSIS:  uterine fibroids  POST-OPERATIVE DIAGNOSIS:  uterine fibroids  PROCEDURE:  Procedure(s): Radio Frequency Ablation with Sonata (N/A) DILATATION & CURETTAGE/HYSTEROSCOPY WITH MYOSURE (N/A)  SURGEON:  Surgeon(s) and Role:    * Chynna Buerkle, Alanda Slim, MD - Primary  PHYSICIAN ASSISTANT:   ASSISTANTS: {ASSISTANTS:31801}   ANESTHESIA:   {procedures; anesthesia:812}  EBL:  20 mL   BLOOD ADMINISTERED:{BLOOD GIVEN TYPES AND AMOUNTS:20467}  DRAINS: {Devices; drains:31758}   LOCAL MEDICATIONS USED:  {LOCAL MEDICATIONS:10721995}  SPECIMEN:  {ONC STAGING; AJCC TYPE OF SPECIMEN:115600001}  DISPOSITION OF SPECIMEN:  {SPECIMEN DISPOSITION:204680}  COUNTS:  {OR COUNTS CORRECT/INCORRECT:204690}  TOURNIQUET:  * No tourniquets in log *  DICTATION: .{DICTATION HUTML:4650354656}  PLAN OF CARE: {OPTIME PLAN OF CLEX:5170017494}  PATIENT DISPOSITION:  {op note disposition:31782}   Delay start of Pharmacological VTE agent (>24hrs) due to surgical blood loss or risk of bleeding: {YES/NO/NOT APPLICABLE:20182}

## 2022-01-08 NOTE — Op Note (Unsigned)
Diana Craig, Diana Craig MEDICAL RECORD NO: [id number] ACCOUNT NO: [id number] DATE OF BIRTH: 1975/03/28 FACILITY: [facility] LOCATION: [facility] PHYSICIAN: Jalesia Loudenslager A. Garwin Brothers, MD  Operative Report   DATE OF PROCEDURE: 01/07/2022  PREOPERATIVE DIAGNOSES:  Pelvic pain, menorrhagia and uterine fibroids.  PROCEDURE:  Radiofrequency transcervical ablation of uterine fibroids using Sonata diagnostic hysteroscopy, hysteroscopic resection using myosure, Dilation and curettage  POSTOPERATIVE DIAGNOSES:  Menorrhagia, pelvic pain, uterine fibroids (subserosal and intramural fibroids).  DESCRIPTION OF PROCEDURE:  Under adequate general anesthesia, the patient was placed in the dorsal lithotomy position.  She was sterilely prepped and draped for the procedure. The bladder had been catheterized for small amount of urine.  Examination  under anesthesia revealed an anteverted 11 week size uterus, irregular in shape.  No adnexal masses could be appreciated.  Bivalve speculum was placed in the vagina.  A single-tooth tenaculum was placed on the anterior lip of the cervix.  The cervix was  then serially dilated up to #19 [facility] dilator.  A diagnostic hysteroscope was introduced into the intrauterine cavity, possible posterior lower uterine segment submucosal fibroid was noted.  The fundal area suggestive of maybe septum, but was not clear and the tubal ostia was not clearly  seen, although the directions to both could be seen.  Nonetheless, the hysteroscope was removed and the Sonata handpiece was then inserted and a complete uterine survey was performed with the ultrasound.  Sonata fibroid ablations were then performed.   Eight fibroids were done in total.  Fibroid #1 was of the 1 o'clock position mid anterior fundal 3 cm, had two ablations, 1.  2.6 x 1.9 for 2 minutes and 18 seconds. 2.  2.4 x 1.7 with a time of 2 minutes. Fibroid #2 at the 2 cm at the 12 o'clock position mid anterior fundal ablation of 2.7  x 2.0 with a time of 2 minutes and 30 seconds. Fibroid# 3 at the 11 o'clock position 3 cm mid body anterior ablation size of 2.7 x 2.0 with a time of 2 minutes and 30 seconds. Fibroid #4 at the 3 o'clock position mid body type 3 2 cm had an ablation size of 2.2 x 1.5 with a time of 1 minute and 30 seconds. Fibroid #5 at 6 o'clock position, 4 cm posterior mid body had two ablations. 1.  At 3.3 x 2.4 with 3 minutes and 30 seconds. 2.  3.2 x 2.4 with a time of 3 minutes and 24 seconds. Fibroid #6 with the 5 o'clock position, 3 cm posterior mid body.  Ablation size of 3.3 x 2.4 cm with a time of 3 minutes and 36 seconds. Fibroid #7 at 9 o'clock position, 2 cm right lateral lower uterine segment type 5, had ablation size of 3.5 x 2.6 cm with a time of ablation of 3 minutes and 54 seconds. Fibroid #8 at the 9 o'clock position, 2 cm type 4.  Ablation size of 3.1 x 2.3 cm with a time of 3 minutes and 12 seconds. All cycles were carried out under direct ultrasound intrauterine guidance and visualization with the ablation guide noted to be within the serosa at all the time.  The fibroids appeared ablated with ultrasound guidance with outgassing noted by ultrasound appearance.  The hysteroscope was then reinserted which demonstrated complete ablation on where the fibroids were located.  There was no evidence of endometrial involvement on inspection. The Lite resectoscope was then utilized to resect the  endometrium.  When no other findings were noted,all instruments were then removed  from the vagina.  Hemostasis was noted.  SPECIMEN:  Endometrial curetting sent to pathology. Estimated blood loss: 20 ml Fluid deficit was 063 mL  COMPLICATIONS:  None.  The patient tolerated the procedure well, was transferred to recovery room in stable condition.   SHY D: 01/08/2022 12:15:04 pm T: 01/08/2022 1:23:00 pm  JOB: [id number]/ [id number]

## 2022-01-10 ENCOUNTER — Encounter (HOSPITAL_BASED_OUTPATIENT_CLINIC_OR_DEPARTMENT_OTHER): Payer: Self-pay | Admitting: Obstetrics and Gynecology

## 2022-01-10 LAB — SURGICAL PATHOLOGY

## 2022-01-10 NOTE — Anesthesia Postprocedure Evaluation (Signed)
Anesthesia Post Note  Patient: Diana Craig  Procedure(s) Performed: Radio Frequency Ablation with [employer] (Uterus) DILATATION & CURETTAGE/HYSTEROSCOPY WITH MYOSURE (Uterus)     Patient location during evaluation: PACU Anesthesia Type: General Level of consciousness: awake and alert Pain management: pain level controlled Vital Signs Assessment: post-procedure vital signs reviewed and stable Respiratory status: spontaneous breathing, nonlabored ventilation, respiratory function stable and patient connected to nasal cannula oxygen Cardiovascular status: blood pressure returned to baseline and stable Postop Assessment: no apparent nausea or vomiting Anesthetic complications: no  No notable events documented.  Last Vitals:  Vitals:   01/07/22 1530 01/07/22 1610  BP: 111/78 109/70  Pulse: (!) 59 80  Resp: 10 15  Temp:  36.7 C  SpO2: 99% 99%    Last Pain:  Vitals:   01/10/22 1109  TempSrc:   PainSc: 0-No pain   Pain Goal: Patients Stated Pain Goal: 7 (01/07/22 1610)                 Barnet Glasgow

## 2022-01-20 ENCOUNTER — Encounter: Payer: Self-pay | Admitting: Internal Medicine

## 2022-01-20 ENCOUNTER — Ambulatory Visit (INDEPENDENT_AMBULATORY_CARE_PROVIDER_SITE_OTHER): Payer: PRIVATE HEALTH INSURANCE | Admitting: Internal Medicine

## 2022-01-20 ENCOUNTER — Other Ambulatory Visit (HOSPITAL_BASED_OUTPATIENT_CLINIC_OR_DEPARTMENT_OTHER): Payer: Self-pay

## 2022-01-20 VITALS — BP 110/64 | Temp 97.9°F | Ht 63.2 in | Wt 162.2 lb

## 2022-01-20 DIAGNOSIS — Z6828 Body mass index (BMI) 28.0-28.9, adult: Secondary | ICD-10-CM | POA: Diagnosis not present

## 2022-01-20 MED ORDER — WEGOVY 1 MG/0.5ML ~~LOC~~ SOAJ
1.0000 mg | SUBCUTANEOUS | 1 refills | Status: DC
  Filled 2022-01-20: qty 2, 28d supply, fill #0
  Filled 2022-02-17 (×2): qty 2, 28d supply, fill #1

## 2022-01-20 NOTE — Progress Notes (Signed)
Rich Brave Llittleton,acting as a Education administrator for Maximino Greenland, MD.,have documented all relevant documentation on the behalf of Maximino Greenland, MD,as directed by  Maximino Greenland, MD while in the presence of Maximino Greenland, MD.    Subjective:     Patient ID: Diana Craig , female    DOB: Aug 17, 1975 , 46 y.o.   MRN: [id number]   Chief Complaint  Patient presents with   Weight Check    HPI  Patient presents today for a weight check. Patient reports compliance with ENIDPO. Patient does not have any questions or concerns at this time.      Past Medical History:  Diagnosis Date   Anxiety    GERD (gastroesophageal reflux disease)    Headache(784.0)    menstrual cycle related   IDA (iron deficiency anemia)    Uterine fibroid      Family History  Problem Relation Age of Onset   COPD Mother    Hypertension Mother    Other Father        health status unknown   Breast cancer Neg Hx      Current Outpatient Medications:    acetaminophen (TYLENOL) 500 MG tablet, Take 500 mg by mouth every 6 (six) hours as needed., Disp: , Rfl:    Cholecalciferol (VITAMIN D3) 50 MCG (2000 UT) TABS, Take by mouth daily., Disp: , Rfl:    DAYVIGO 5 MG TABS, TAKE 1 TABLET BY MOUTH EVERY DAY AT BEDTIME AS NEEDED (Patient taking differently: Take 1 tablet by mouth at bedtime as needed.), Disp: 30 tablet, Rfl: 2   docusate sodium (COLACE) 100 MG capsule, Take 100 mg by mouth daily as needed for mild constipation., Disp: , Rfl:    Ferrous Sulfate (IRON PO), Take 16 mLs by mouth daily., Disp: , Rfl:    Multiple Vitamins-Minerals (MULTIVITAMIN ADULT PO), Take by mouth 2 (two) times daily., Disp: , Rfl:    Semaglutide-Weight Management (WEGOVY) 1 MG/0.5ML SOAJ, Inject 1 mg into the skin once a week. Sunday's, Disp: 6 mL, Rfl: 1   No Known Allergies   Review of Systems  Constitutional: Negative.   Respiratory: Negative.    Cardiovascular: Negative.   Gastrointestinal: Negative.   Neurological:  Negative.   Psychiatric/Behavioral: Negative.       Today's Vitals   01/20/22 1547  BP: 110/64  Temp: 97.9 F (36.6 C)  Weight: 162 lb 3.2 oz (73.6 kg)  Height: 5' 3.2" (1.605 m)  PainSc: 0-No pain   Body mass index is 28.55 kg/m.  Wt Readings from Last 3 Encounters:  01/20/22 162 lb 3.2 oz (73.6 kg)  01/07/22 165 lb 12.8 oz (75.2 kg)  11/11/21 161 lb 8 oz (73.3 kg)     Objective:  Physical Exam Vitals and nursing note reviewed.  Constitutional:      Appearance: Normal appearance.  HENT:     Head: Normocephalic and atraumatic.     Nose:     Comments: Masked     Mouth/Throat:     Comments: Masked  Eyes:     Extraocular Movements: Extraocular movements intact.  Cardiovascular:     Rate and Rhythm: Normal rate and regular rhythm.     Heart sounds: Normal heart sounds.  Pulmonary:     Effort: Pulmonary effort is normal.     Breath sounds: Normal breath sounds.  Skin:    General: Skin is warm.  Neurological:     General: No focal deficit present.  Mental Status: She is alert.  Psychiatric:        Mood and Affect: Mood normal.        Behavior: Behavior normal.       Assessment And Plan:     1. BMI 28.0-28.9,adult Comments: She will c/w Wegovy 31m weekly. Her goal weight is 1684m She will f/u in 8 weeks.  2. Hypocalcemia Comments: Previous labs reviewed. I will recheck CMP, she agrees to go to lab in her network. I will make recommendations once her labs are available for review. - CMP14+EGFR; Future - Vitamin D (25 hydroxy); Future   Patient was given opportunity to ask questions. Patient verbalized understanding of the plan and was able to repeat key elements of the plan. All questions were answered to their satisfaction.   I, RoMaximino GreenlandMD, have reviewed all documentation for this visit. The documentation on 01/20/22 for the exam, diagnosis, procedures, and orders are all accurate and complete.   IF YOU HAVE BEEN REFERRED TO A SPECIALIST, IT MAY  TAKE 1-2 WEEKS TO SCHEDULE/PROCESS THE REFERRAL. IF YOU HAVE NOT HEARD FROM US/SPECIALIST IN TWO WEEKS, PLEASE GIVE [place] CALL AT [phone].   THE PATIENT IS ENCOURAGED TO PRACTICE SOCIAL DISTANCING DUE TO THE COVID-19 PANDEMIC.

## 2022-01-20 NOTE — Patient Instructions (Signed)

## 2022-01-21 ENCOUNTER — Encounter: Payer: Self-pay | Admitting: Internal Medicine

## 2022-02-17 ENCOUNTER — Other Ambulatory Visit: Payer: Self-pay | Admitting: Internal Medicine

## 2022-02-17 ENCOUNTER — Other Ambulatory Visit (HOSPITAL_BASED_OUTPATIENT_CLINIC_OR_DEPARTMENT_OTHER): Payer: Self-pay

## 2022-02-23 ENCOUNTER — Other Ambulatory Visit (HOSPITAL_COMMUNITY): Payer: Self-pay

## 2022-02-23 ENCOUNTER — Other Ambulatory Visit (HOSPITAL_BASED_OUTPATIENT_CLINIC_OR_DEPARTMENT_OTHER): Payer: Self-pay

## 2022-02-23 ENCOUNTER — Other Ambulatory Visit: Payer: Self-pay

## 2022-02-23 ENCOUNTER — Encounter: Payer: Self-pay | Admitting: Internal Medicine

## 2022-02-23 MED ORDER — WEGOVY 1.7 MG/0.75ML ~~LOC~~ SOAJ
1.7000 mg | SUBCUTANEOUS | 0 refills | Status: DC
  Filled 2022-02-23 (×2): qty 3, 28d supply, fill #0

## 2022-03-28 ENCOUNTER — Other Ambulatory Visit: Payer: Self-pay | Admitting: Internal Medicine

## 2022-03-28 ENCOUNTER — Other Ambulatory Visit (HOSPITAL_BASED_OUTPATIENT_CLINIC_OR_DEPARTMENT_OTHER): Payer: Self-pay

## 2022-03-28 MED ORDER — WEGOVY 1.7 MG/0.75ML ~~LOC~~ SOAJ
1.7000 mg | SUBCUTANEOUS | 0 refills | Status: DC
  Filled 2022-03-28 (×2): qty 3, 28d supply, fill #0

## 2022-03-29 ENCOUNTER — Other Ambulatory Visit (HOSPITAL_BASED_OUTPATIENT_CLINIC_OR_DEPARTMENT_OTHER): Payer: Self-pay

## 2022-04-05 ENCOUNTER — Encounter: Payer: Self-pay | Admitting: Internal Medicine

## 2022-04-05 ENCOUNTER — Ambulatory Visit: Payer: PRIVATE HEALTH INSURANCE | Admitting: Internal Medicine

## 2022-04-05 VITALS — BP 110/72 | HR 63 | Temp 98.2°F | Ht 63.2 in | Wt 164.6 lb

## 2022-04-05 DIAGNOSIS — F5101 Primary insomnia: Secondary | ICD-10-CM | POA: Diagnosis not present

## 2022-04-05 DIAGNOSIS — Z6828 Body mass index (BMI) 28.0-28.9, adult: Secondary | ICD-10-CM

## 2022-04-05 MED ORDER — WEGOVY 1.7 MG/0.75ML ~~LOC~~ SOAJ: 1.7000 mg | SUBCUTANEOUS | 2 refills | Status: DC

## 2022-04-05 NOTE — Progress Notes (Incomplete Revision)
Diana Craig,acting as a Education administrator for Diana Greenland, MD.,have documented all relevant documentation on the behalf of Diana Greenland, MD,as directed by  Diana Greenland, MD while in the presence of Diana Greenland, MD.    Subjective:     Patient ID: Diana Craig , female    DOB: October 25, 1975 , 47 y.o.   MRN: [id number]   Chief Complaint  Patient presents with  . Weight Check    HPI  Patient presents today for a weight check. Patient reports compliance with YIRSWN. Patient does not have any questions or concerns at this time. She is still exercising on a regular basis.     Past Medical History:  Diagnosis Date  . Anxiety   . GERD (gastroesophageal reflux disease)   . Headache(784.0)    menstrual cycle related  . IDA (iron deficiency anemia)   . Uterine fibroid      Family History  Problem Relation Age of Onset  . COPD Mother   . Hypertension Mother   . Other Father        health status unknown  . Breast cancer Neg Hx      Current Outpatient Medications:  .  acetaminophen (TYLENOL) 500 MG tablet, Take 500 mg by mouth every 6 (six) hours as needed., Disp: , Rfl:  .  Cholecalciferol (VITAMIN D3) 50 MCG (2000 UT) TABS, Take by mouth daily., Disp: , Rfl:  .  DAYVIGO 5 MG TABS, TAKE 1 TABLET BY MOUTH EVERY DAY AT BEDTIME AS NEEDED, Disp: 30 tablet, Rfl: 2 .  docusate sodium (COLACE) 100 MG capsule, Take 100 mg by mouth daily as needed for mild constipation., Disp: , Rfl:  .  Ferrous Sulfate (IRON PO), Take 16 mLs by mouth daily., Disp: , Rfl:  .  Multiple Vitamins-Minerals (MULTIVITAMIN ADULT PO), Take by mouth 2 (two) times daily., Disp: , Rfl:  .  Semaglutide-Weight Management (WEGOVY) 1.7 MG/0.75ML SOAJ, Inject 1.7 mg into the skin once a week., Disp: 3 mL, Rfl: 0   No Known Allergies   Review of Systems  Constitutional: Negative.   Respiratory: Negative.    Cardiovascular: Negative.   Gastrointestinal: Negative.   Neurological: Negative.    Psychiatric/Behavioral: Negative.       Today's Vitals   04/05/22 1547  BP: 110/72  Pulse: 63  Temp: 98.2 F (36.8 C)  Weight: 164 lb 9.6 oz (74.7 kg)  Height: 5' 3.2" (1.605 m)  PainSc: 0-No pain   Body mass index is 28.97 kg/m.  Wt Readings from Last 3 Encounters:  04/05/22 164 lb 9.6 oz (74.7 kg)  01/20/22 162 lb 3.2 oz (73.6 kg)  01/07/22 165 lb 12.8 oz (75.2 kg)     Objective:  Physical Exam Vitals and nursing note reviewed.  Constitutional:      Appearance: Normal appearance.  HENT:     Head: Normocephalic and atraumatic.     Nose:     Comments: Masked     Mouth/Throat:     Comments: Masked  Eyes:     Extraocular Movements: Extraocular movements intact.  Cardiovascular:     Rate and Rhythm: Normal rate and regular rhythm.     Heart sounds: Normal heart sounds.  Pulmonary:     Effort: Pulmonary effort is normal.     Breath sounds: Normal breath sounds.  Musculoskeletal:     Cervical back: Normal range of motion.  Skin:    General: Skin is warm.  Neurological:  General: No focal deficit present.     Mental Status: She is alert.  Psychiatric:        Mood and Affect: Mood normal.        Behavior: Behavior normal.        Assessment And Plan:     1. BMI 28.0-28.9,adult     Patient was given opportunity to ask questions. Patient verbalized understanding of the plan and was able to repeat key elements of the plan. All questions were answered to their satisfaction.  Diana Greenland, MD   I, Diana Greenland, MD, have reviewed all documentation for this visit. The documentation on 04/05/22 for the exam, diagnosis, procedures, and orders are all accurate and complete.   IF YOU HAVE BEEN REFERRED TO A SPECIALIST, IT MAY TAKE 1-2 WEEKS TO SCHEDULE/PROCESS THE REFERRAL. IF YOU HAVE NOT HEARD FROM US/SPECIALIST IN TWO WEEKS, PLEASE GIVE [place] A CALL AT [phone].   THE PATIENT IS ENCOURAGED TO PRACTICE SOCIAL DISTANCING DUE TO THE COVID-19 PANDEMIC.

## 2022-04-05 NOTE — Patient Instructions (Signed)

## 2022-04-05 NOTE — Progress Notes (Addendum)
Diana Craig,acting as a Education administrator for Diana Greenland, MD.,have documented all relevant documentation on the behalf of Diana Greenland, MD,as directed by  Diana Greenland, MD while in the presence of Diana Greenland, MD.    Subjective:     Patient ID: Diana Craig , female    DOB: 1975/05/15 , 47 y.o.   MRN: [id number]   Chief Complaint  Patient presents with   Weight Check    HPI  Patient presents today for a weight check. Patient reports compliance with GXQJJH. Patient does not have any questions or concerns at this time. She is still exercising on a regular basis. She has reached her goal weight.      Past Medical History:  Diagnosis Date   Anxiety    GERD (gastroesophageal reflux disease)    Headache(784.0)    menstrual cycle related   IDA (iron deficiency anemia)    Uterine fibroid      Family History  Problem Relation Age of Onset   COPD Mother    Hypertension Mother    Other Father        health status unknown   Breast cancer Neg Hx      Current Outpatient Medications:    acetaminophen (TYLENOL) 500 MG tablet, Take 500 mg by mouth every 6 (six) hours as needed., Disp: , Rfl:    Cholecalciferol (VITAMIN D3) 50 MCG (2000 UT) TABS, Take by mouth daily., Disp: , Rfl:    DAYVIGO 5 MG TABS, TAKE 1 TABLET BY MOUTH EVERY DAY AT BEDTIME AS NEEDED, Disp: 30 tablet, Rfl: 2   docusate sodium (COLACE) 100 MG capsule, Take 100 mg by mouth daily as needed for mild constipation., Disp: , Rfl:    Ferrous Sulfate (IRON PO), Take 16 mLs by mouth daily., Disp: , Rfl:    Multiple Vitamins-Minerals (MULTIVITAMIN ADULT PO), Take by mouth 2 (two) times daily., Disp: , Rfl:    Semaglutide-Weight Management (WEGOVY) 1.7 MG/0.75ML SOAJ, Inject 1.7 mg into the skin once a week., Disp: 3 mL, Rfl: 2   No Known Allergies   Review of Systems  Constitutional: Negative.   Respiratory: Negative.    Cardiovascular: Negative.   Gastrointestinal: Negative.   Neurological:  Negative.   Psychiatric/Behavioral: Negative.       Today's Vitals   04/05/22 1547  BP: 110/72  Pulse: 63  Temp: 98.2 F (36.8 C)  Weight: 164 lb 9.6 oz (74.7 kg)  Height: 5' 3.2" (1.605 m)  PainSc: 0-No pain   Body mass index is 28.97 kg/m.  Wt Readings from Last 3 Encounters:  04/05/22 164 lb 9.6 oz (74.7 kg)  01/20/22 162 lb 3.2 oz (73.6 kg)  01/07/22 165 lb 12.8 oz (75.2 kg)     Objective:  Physical Exam Vitals and nursing note reviewed.  Constitutional:      Appearance: Normal appearance.  HENT:     Head: Normocephalic and atraumatic.     Nose:     Comments: Masked     Mouth/Throat:     Comments: Masked  Eyes:     Extraocular Movements: Extraocular movements intact.  Cardiovascular:     Rate and Rhythm: Normal rate and regular rhythm.     Heart sounds: Normal heart sounds.  Pulmonary:     Effort: Pulmonary effort is normal.     Breath sounds: Normal breath sounds.  Musculoskeletal:     Cervical back: Normal range of motion.  Skin:    General: Skin is  warm.  Neurological:     General: No focal deficit present.     Mental Status: She is alert.  Psychiatric:        Mood and Affect: Mood normal.        Behavior: Behavior normal.         Assessment And Plan:     1. BMI 28.0-28.9,adult Comments: She has gained two pounds in 2 months. She will c/w [facility] 1.'7mg'$  weekly. She will f/u in 8 weeks.  2. Primary insomnia Comments: Chronic, she will c/w Dayvigo nightly. She is encouraged to practice good bedtime hygiene.     Patient was given opportunity to ask questions. Patient verbalized understanding of the plan and was able to repeat key elements of the plan. All questions were answered to their satisfaction.   I, Diana Greenland, MD, have reviewed all documentation for this visit. The documentation on 04/05/22 for the exam, diagnosis, procedures, and orders are all accurate and complete.   IF YOU HAVE BEEN REFERRED TO A SPECIALIST, IT MAY TAKE 1-2 WEEKS  TO SCHEDULE/PROCESS THE REFERRAL. IF YOU HAVE NOT HEARD FROM US/SPECIALIST IN TWO WEEKS, PLEASE GIVE [place] A CALL AT [phone].   THE PATIENT IS ENCOURAGED TO PRACTICE SOCIAL DISTANCING DUE TO THE COVID-19 PANDEMIC.

## 2022-05-09 ENCOUNTER — Encounter: Payer: Self-pay | Admitting: Internal Medicine

## 2022-05-11 ENCOUNTER — Other Ambulatory Visit: Payer: Self-pay

## 2022-05-11 ENCOUNTER — Other Ambulatory Visit: Payer: Self-pay | Admitting: Internal Medicine

## 2022-05-11 MED ORDER — DAYVIGO 5 MG PO TABS: ORAL_TABLET | ORAL | 1 refills | Status: DC

## 2022-05-20 ENCOUNTER — Ambulatory Visit
Admission: RE | Admit: 2022-05-20 | Discharge: 2022-05-20 | Disposition: A | Discharge status: Home / Self Care | Payer: PRIVATE HEALTH INSURANCE | Source: Ambulatory Visit | Attending: Obstetrics and Gynecology | Admitting: Obstetrics and Gynecology

## 2022-05-20 DIAGNOSIS — N6320 Unspecified lump in the left breast, unspecified quadrant: Secondary | ICD-10-CM

## 2022-05-23 ENCOUNTER — Other Ambulatory Visit: Payer: Self-pay | Admitting: Obstetrics and Gynecology

## 2022-05-23 DIAGNOSIS — R921 Mammographic calcification found on diagnostic imaging of breast: Secondary | ICD-10-CM

## 2022-05-27 ENCOUNTER — Encounter: Payer: Self-pay | Admitting: Internal Medicine

## 2022-05-30 ENCOUNTER — Other Ambulatory Visit: Payer: Self-pay

## 2022-06-03 ENCOUNTER — Ambulatory Visit
Admission: RE | Admit: 2022-06-03 | Discharge: 2022-06-03 | Disposition: A | Discharge status: Home / Self Care | Payer: PRIVATE HEALTH INSURANCE | Source: Ambulatory Visit | Attending: Obstetrics and Gynecology | Admitting: Obstetrics and Gynecology

## 2022-06-03 DIAGNOSIS — R921 Mammographic calcification found on diagnostic imaging of breast: Secondary | ICD-10-CM

## 2022-06-03 HISTORY — PX: BREAST BIOPSY: SHX20

## 2022-06-08 ENCOUNTER — Other Ambulatory Visit: Payer: Self-pay | Admitting: Obstetrics and Gynecology

## 2022-06-08 DIAGNOSIS — R921 Mammographic calcification found on diagnostic imaging of breast: Secondary | ICD-10-CM

## 2022-06-14 ENCOUNTER — Ambulatory Visit: Payer: PRIVATE HEALTH INSURANCE | Admitting: Internal Medicine

## 2022-06-20 ENCOUNTER — Other Ambulatory Visit: Payer: Self-pay | Admitting: Internal Medicine

## 2022-06-21 ENCOUNTER — Ambulatory Visit
Admission: RE | Admit: 2022-06-21 | Discharge: 2022-06-21 | Disposition: A | Discharge status: Home / Self Care | Payer: PRIVATE HEALTH INSURANCE | Source: Ambulatory Visit | Attending: Obstetrics and Gynecology | Admitting: Obstetrics and Gynecology

## 2022-06-21 ENCOUNTER — Other Ambulatory Visit: Payer: Self-pay | Admitting: Obstetrics and Gynecology

## 2022-06-21 DIAGNOSIS — R921 Mammographic calcification found on diagnostic imaging of breast: Secondary | ICD-10-CM

## 2022-06-21 HISTORY — PX: BREAST BIOPSY: SHX20

## 2022-06-27 ENCOUNTER — Other Ambulatory Visit: Payer: Self-pay | Admitting: Obstetrics and Gynecology

## 2022-06-27 ENCOUNTER — Ambulatory Visit
Admission: RE | Admit: 2022-06-27 | Discharge: 2022-06-27 | Disposition: A | Discharge status: Home / Self Care | Payer: PRIVATE HEALTH INSURANCE | Source: Ambulatory Visit | Attending: Obstetrics and Gynecology | Admitting: Obstetrics and Gynecology

## 2022-06-27 DIAGNOSIS — R921 Mammographic calcification found on diagnostic imaging of breast: Secondary | ICD-10-CM

## 2022-06-27 HISTORY — PX: BREAST BIOPSY: SHX20

## 2022-06-28 ENCOUNTER — Encounter: Payer: Self-pay | Admitting: Internal Medicine

## 2022-06-28 ENCOUNTER — Ambulatory Visit (INDEPENDENT_AMBULATORY_CARE_PROVIDER_SITE_OTHER): Payer: PRIVATE HEALTH INSURANCE | Admitting: Internal Medicine

## 2022-06-28 VITALS — BP 110/86 | HR 75 | Temp 98.2°F | Ht 63.0 in | Wt 168.4 lb

## 2022-06-28 DIAGNOSIS — Z6829 Body mass index (BMI) 29.0-29.9, adult: Secondary | ICD-10-CM

## 2022-06-28 DIAGNOSIS — R928 Other abnormal and inconclusive findings on diagnostic imaging of breast: Secondary | ICD-10-CM | POA: Diagnosis not present

## 2022-06-28 NOTE — Patient Instructions (Signed)

## 2022-06-28 NOTE — Progress Notes (Unsigned)
I,Diana Craig,acting as a scribe for Diana Aliment, MD.,have documented all relevant documentation on the behalf of Diana Aliment, MD,as directed by  Diana Aliment, MD while in the presence of Diana Aliment, MD.    Subjective:     Patient ID: Diana Craig , female    DOB: January 20, 1976 , 47 y.o.   MRN: [id number]   Chief Complaint  Patient presents with   Weight Check    HPI  Patient presents today for a weight check. Patient reports compliance with WUJWJX. Patient does not have any questions or concerns at this time. She is still exercising on a regular basis. She has reached her goal weight. She has had no issue with down-titration of the medication.       Past Medical History:  Diagnosis Date   Anxiety    GERD (gastroesophageal reflux disease)    Headache(784.0)    menstrual cycle related   IDA (iron deficiency anemia)    Uterine fibroid      Family History  Problem Relation Age of Onset   COPD Mother    Hypertension Mother    Other Father        health status unknown   Breast cancer Neg Hx      Current Outpatient Medications:    Cholecalciferol (VITAMIN D3) 50 MCG (2000 UT) TABS, Take by mouth daily., Disp: , Rfl:    docusate sodium (COLACE) 100 MG capsule, Take 100 mg by mouth daily as needed for mild constipation., Disp: , Rfl:    Ferrous Sulfate (IRON PO), Take 16 mLs by mouth daily., Disp: , Rfl:    Lemborexant (DAYVIGO) 5 MG TABS, TAKE 1 TABLET BY MOUTH EVERY DAY AT BEDTIME AS NEEDED, Disp: 30 tablet, Rfl: 2   Multiple Vitamins-Minerals (MULTIVITAMIN ADULT PO), Take by mouth 2 (two) times daily., Disp: , Rfl:    Semaglutide-Weight Management (WEGOVY) 1.7 MG/0.75ML SOAJ, Inject 1.7 mg into the skin once a week., Disp: 3 mL, Rfl: 2   acetaminophen (TYLENOL) 500 MG tablet, Take 500 mg by mouth every 6 (six) hours as needed. (Patient not taking: Reported on 06/28/2022), Disp: , Rfl:    No Known Allergies   Review of Systems   Constitutional: Negative.   Respiratory: Negative.    Cardiovascular: Negative.   Gastrointestinal: Negative.   Musculoskeletal: Negative.   Skin: Negative.   Neurological: Negative.   Psychiatric/Behavioral: Negative.       Today's Vitals   06/28/22 1549  BP: 110/86  Pulse: 75  Temp: 98.2 F (36.8 C)  SpO2: 98%  Weight: 168 lb 6.4 oz (76.4 kg)  Height:  (1.6 m)   Body mass index is 29.83 kg/m.  Wt Readings from Last 3 Encounters:  06/28/22 168 lb 6.4 oz (76.4 kg)  04/05/22 164 lb 9.6 oz (74.7 kg)  01/20/22 162 lb 3.2 oz (73.6 kg)    Objective:  Physical Exam Vitals and nursing note reviewed.  Constitutional:      Appearance: Normal appearance.  HENT:     Head: Normocephalic and atraumatic.  Eyes:     Extraocular Movements: Extraocular movements intact.  Cardiovascular:     Rate and Rhythm: Normal rate and regular rhythm.     Heart sounds: Normal heart sounds.  Pulmonary:     Effort: Pulmonary effort is normal.     Breath sounds: Normal breath sounds.  Musculoskeletal:     Cervical back: Normal range of motion.  Skin:    General:  Skin is warm.  Neurological:     General: No focal deficit present.     Mental Status: She is alert.  Psychiatric:        Mood and Affect: Mood normal.        Behavior: Behavior normal.     Assessment And Plan:     1. Body mass index (BMI) of 29.0 to 29.9 in adult Comments: She is aware of 4lb weight gain. Agrees to return to weekly dosing x 6-8 weeks. She will c/w [facility] 1.7mg  for now.  F/u 8 -10 weeks.  2. Abnormal mammogram of left breast Comments: Resultant u/s, diagnostic mammo and biopsy results were reviewed in detail. Biopsy was negative for malignancy.     Patient was given opportunity to ask questions. Patient verbalized understanding of the plan and was able to repeat key elements of the plan. All questions were answered to their satisfaction.    I, Diana Aliment, MD, have reviewed all documentation for  this visit. The documentation on 06/28/22 for the exam, diagnosis, procedures, and orders are all accurate and complete.   IF YOU HAVE BEEN REFERRED TO A SPECIALIST, IT MAY TAKE 1-2 WEEKS TO SCHEDULE/PROCESS THE REFERRAL. IF YOU HAVE NOT HEARD FROM US/SPECIALIST IN TWO WEEKS, PLEASE GIVE [place] A CALL AT [phone].   THE PATIENT IS ENCOURAGED TO PRACTICE SOCIAL DISTANCING DUE TO THE COVID-19 PANDEMIC.

## 2022-07-05 ENCOUNTER — Other Ambulatory Visit: Payer: Self-pay | Admitting: Obstetrics and Gynecology

## 2022-07-05 ENCOUNTER — Ambulatory Visit
Admission: RE | Admit: 2022-07-05 | Discharge: 2022-07-05 | Disposition: A | Discharge status: Home / Self Care | Payer: PRIVATE HEALTH INSURANCE | Source: Ambulatory Visit | Attending: Obstetrics and Gynecology | Admitting: Obstetrics and Gynecology

## 2022-07-05 DIAGNOSIS — R921 Mammographic calcification found on diagnostic imaging of breast: Secondary | ICD-10-CM

## 2022-07-05 HISTORY — PX: BREAST BIOPSY: SHX20

## 2022-07-08 ENCOUNTER — Other Ambulatory Visit: Payer: Self-pay | Admitting: Surgery

## 2022-07-08 DIAGNOSIS — N6092 Unspecified benign mammary dysplasia of left breast: Secondary | ICD-10-CM

## 2022-07-14 ENCOUNTER — Other Ambulatory Visit: Payer: Self-pay | Admitting: Surgery

## 2022-07-14 DIAGNOSIS — N6092 Unspecified benign mammary dysplasia of left breast: Secondary | ICD-10-CM

## 2022-07-29 ENCOUNTER — Other Ambulatory Visit: Payer: Self-pay

## 2022-07-29 MED ORDER — WEGOVY 1.7 MG/0.75ML ~~LOC~~ SOAJ: 1.7000 mg | SUBCUTANEOUS | 2 refills | Status: DC

## 2022-08-16 ENCOUNTER — Encounter: Payer: Self-pay | Admitting: Internal Medicine

## 2022-08-16 ENCOUNTER — Ambulatory Visit (INDEPENDENT_AMBULATORY_CARE_PROVIDER_SITE_OTHER): Payer: PRIVATE HEALTH INSURANCE | Admitting: Internal Medicine

## 2022-08-16 VITALS — BP 110/72 | HR 67 | Temp 97.7°F | Ht 63.0 in | Wt 166.8 lb

## 2022-08-16 DIAGNOSIS — N6092 Unspecified benign mammary dysplasia of left breast: Secondary | ICD-10-CM

## 2022-08-16 DIAGNOSIS — Z0000 Encounter for general adult medical examination without abnormal findings: Secondary | ICD-10-CM | POA: Diagnosis not present

## 2022-08-16 DIAGNOSIS — Z6829 Body mass index (BMI) 29.0-29.9, adult: Secondary | ICD-10-CM | POA: Diagnosis not present

## 2022-08-16 LAB — LIPID PANEL
Chol/HDL Ratio: 2.5 ratio (ref 0.0–4.4)
Cholesterol, Total: 171 mg/dL (ref 100–199)
HDL: 68 mg/dL (ref >39)
LDL Chol Calc (NIH): 94 mg/dL (ref 0–99)
Triglycerides: 43 mg/dL (ref 0–149)
VLDL Cholesterol Cal: 9 mg/dL (ref 5–40)

## 2022-08-16 LAB — CBC
Hematocrit: 38 % (ref 34.0–46.6)
Hemoglobin: 12.7 g/dL (ref 11.1–15.9)
MCH: 30 pg (ref 26.6–33.0)
MCHC: 33.4 g/dL (ref 31.5–35.7)
MCV: 90 fL (ref 79–97)
Platelets: 259 10*3/uL (ref 150–450)
RBC: 4.24 x10E6/uL (ref 3.77–5.28)
RDW: 12.2 % (ref 11.7–15.4)
WBC: 3.4 10*3/uL (ref 3.4–10.8)

## 2022-08-16 LAB — CMP14+EGFR
ALT: 17 IU/L (ref 0–32)
AST: 23 IU/L (ref 0–40)
Albumin/Globulin Ratio: 1.7
Albumin: 4 g/dL (ref 3.9–4.9)
Alkaline Phosphatase: 73 IU/L (ref 44–121)
BUN/Creatinine Ratio: 15 (ref 9–23)
BUN: 11 mg/dL (ref 6–24)
Bilirubin Total: 0.3 mg/dL (ref 0.0–1.2)
CO2: 23 mmol/L (ref 20–29)
Calcium: 9.1 mg/dL (ref 8.7–10.2)
Chloride: 103 mmol/L (ref 96–106)
Creatinine, Ser: 0.72 mg/dL (ref 0.57–1.00)
Globulin, Total: 2.4 g/dL (ref 1.5–4.5)
Glucose: 80 mg/dL (ref 70–99)
Potassium: 4.1 mmol/L (ref 3.5–5.2)
Sodium: 137 mmol/L (ref 134–144)
Total Protein: 6.4 g/dL (ref 6.0–8.5)
eGFR: 104 mL/min/{1.73_m2} (ref >59)

## 2022-08-16 LAB — TSH: TSH: 0.927 u[IU]/mL (ref 0.450–4.500)

## 2022-08-16 MED ORDER — WEGOVY 1.7 MG/0.75ML ~~LOC~~ SOAJ
1.7000 mg | SUBCUTANEOUS | 2 refills | Status: DC
Start: 2022-08-16 — End: 2022-10-25

## 2022-08-16 NOTE — Patient Instructions (Signed)

## 2022-08-16 NOTE — Progress Notes (Signed)
Subjective:    Patient ID: Diana Craig , female    DOB: 12/15/1975 , 47 y.o.   MRN: [id number]  Chief Complaint  Patient presents with   Annual Exam    HPI  She is here today for a full physical exam. She is followed by Dr. Cherly Hensen for her pelvic exams. She has no specific concerns or complaints at this time.      Past Medical History:  Diagnosis Date   Anxiety    GERD (gastroesophageal reflux disease)    Headache(784.0)    menstrual cycle related   IDA (iron deficiency anemia)    Uterine fibroid      Family History  Problem Relation Age of Onset   COPD Mother    Hypertension Mother    Other Father        health status unknown   Breast cancer Neg Hx      Current Outpatient Medications:    Cholecalciferol (VITAMIN D3) 50 MCG (2000 UT) TABS, Take by mouth daily., Disp: , Rfl:    docusate sodium (COLACE) 100 MG capsule, Take 100 mg by mouth daily as needed for mild constipation., Disp: , Rfl:    Ferrous Sulfate (IRON PO), Take 16 mLs by mouth daily., Disp: , Rfl:    Lemborexant (DAYVIGO) 5 MG TABS, TAKE 1 TABLET BY MOUTH EVERY DAY AT BEDTIME AS NEEDED, Disp: 30 tablet, Rfl: 2   Multiple Vitamins-Minerals (MULTIVITAMIN ADULT PO), Take by mouth 2 (two) times daily., Disp: , Rfl:    acetaminophen (TYLENOL) 500 MG tablet, Take 500 mg by mouth every 6 (six) hours as needed. (Patient not taking: Reported on 06/28/2022), Disp: , Rfl:    Semaglutide-Weight Management (WEGOVY) 1.7 MG/0.75ML SOAJ, Inject 1.7 mg into the skin once a week., Disp: 3 mL, Rfl: 2   No Known Allergies    The patient states she uses none for birth control. Patient's last menstrual period was 07/07/2022 (exact date).. Negative for Dysmenorrhea. Negative for: breast discharge, breast lump(s), breast pain and breast self exam. Associated symptoms include abnormal vaginal bleeding. Pertinent negatives include abnormal bleeding (hematology), anxiety, decreased libido, depression, difficulty falling  sleep, dyspareunia, history of infertility, nocturia, sexual dysfunction, sleep disturbances, urinary incontinence, urinary urgency, vaginal discharge and vaginal itching. Diet regular.The patient states her exercise level is  moderate.  . The patient's tobacco use is:  Social History   Tobacco Use  Smoking Status Never  Smokeless Tobacco Never  Tobacco Comments   n/a  . She has been exposed to passive smoke. The patient's alcohol use is:  Social History   Substance and Sexual Activity  Alcohol Use Yes   Comment: social    Review of Systems  Constitutional: Negative.   HENT: Negative.    Eyes: Negative.   Respiratory: Negative.    Cardiovascular: Negative.   Gastrointestinal: Negative.   Endocrine: Negative.   Genitourinary: Negative.   Musculoskeletal: Negative.   Skin: Negative.   Allergic/Immunologic: Negative.   Neurological: Negative.   Hematological: Negative.   Psychiatric/Behavioral: Negative.       Today's Vitals   08/16/22 0935  BP: 110/72  Pulse: 67  Temp: 97.7 F (36.5 C)  SpO2: 98%  Weight: 166 lb 12.8 oz (75.7 kg)  Height: 5\' 3"  (1.6 m)   Body mass index is 29.55 kg/m.  Wt Readings from Last 3 Encounters:  08/16/22 166 lb 12.8 oz (75.7 kg)  06/28/22 168 lb 6.4 oz (76.4 kg)  04/05/22 164 lb 9.6 oz (74.7 kg)  Objective:  Physical Exam Vitals and nursing note reviewed.  Constitutional:      Appearance: Normal appearance.  HENT:     Head: Normocephalic and atraumatic.     Right Ear: Tympanic membrane, ear canal and external ear normal.     Left Ear: Tympanic membrane, ear canal and external ear normal.     Nose: Nose normal.     Mouth/Throat:     Mouth: Mucous membranes are moist.     Pharynx: Oropharynx is clear.  Eyes:     Extraocular Movements: Extraocular movements intact.     Conjunctiva/sclera: Conjunctivae normal.     Pupils: Pupils are equal, round, and reactive to light.  Cardiovascular:     Rate and Rhythm: Normal rate and  regular rhythm.     Pulses: Normal pulses.     Heart sounds: Normal heart sounds.  Pulmonary:     Effort: Pulmonary effort is normal.     Breath sounds: Normal breath sounds.  Chest:  Breasts:    Tanner Score is 5.     Right: Normal.     Left: Normal.     Comments: Healed scar left breast Abdominal:     General: Abdomen is flat. Bowel sounds are normal.     Palpations: Abdomen is soft.  Genitourinary:    Comments: deferred Musculoskeletal:        General: Normal range of motion.     Cervical back: Normal range of motion and neck supple.  Skin:    General: Skin is warm and dry.  Neurological:     General: No focal deficit present.     Mental Status: She is alert and oriented to person, place, and time.  Psychiatric:        Mood and Affect: Mood normal.        Behavior: Behavior normal.         Assessment And Plan:     1. Encounter for general adult medical examination w/o abnormal findings Comments: A full exam was performed. Importance of monthly self breast exams was discussed with the patient.PATIENT IS ADVISED TO GET 30-45 MINUTES REGULAR EXERCISE NO LESS THAN FOUR TO FIVE DAYS PER WEEK - BOTH WEIGHTBEARING EXERCISES AND AEROBIC ARE RECOMMENDED.  PATIENT IS ADVISED TO FOLLOW A HEALTHY DIET WITH AT LEAST SIX FRUITS/VEGGIES PER DAY, DECREASE INTAKE OF RED MEAT, AND TO INCREASE FISH INTAKE TO TWO DAYS PER WEEK.  MEATS/FISH SHOULD NOT BE FRIED, BAKED OR BROILED IS PREFERABLE.  IT IS ALSO IMPORTANT TO CUT BACK ON YOUR SUGAR INTAKE. PLEASE AVOID ANYTHING WITH ADDED SUGAR, CORN SYRUP OR OTHER SWEETENERS. IF YOU MUST USE A SWEETENER, YOU CAN TRY STEVIA. IT IS ALSO IMPORTANT TO AVOID ARTIFICIALLY SWEETENERS AND DIET BEVERAGES. LASTLY, I SUGGEST WEARING SPF 50 SUNSCREEN ON EXPOSED PARTS AND ESPECIALLY WHEN IN THE DIRECT SUNLIGHT FOR AN EXTENDED PERIOD OF TIME.  PLEASE AVOID FAST FOOD RESTAURANTS AND INCREASE YOUR WATER INTAKE. - CBC - CMP14+EGFR - Lipid panel - TSH  2. 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 She is scheduled for lumpectomy August 31, 2022.  3. Body mass index (BMI) of 29.0 to 29.9 in adult Comments: She is encouraged to aim for at least 150 minutes of exercise/week. She will c/w [facility] 1.7mg  weekly. - Semaglutide-Weight Management (WEGOVY) 1.7 MG/0.75ML SOAJ; Inject 1.7 mg into the skin once a week.  Dispense: 3 mL; Refill: 2    Return in 10 weeks (on 10/25/2022), or weight check,  virtual ok if pt prefers, for 1 year physical. Patient was given opportunity to ask questions. Patient verbalized understanding of the plan and was able to repeat key elements of the plan. All questions were answered to their satisfaction.   I, Gwynneth Aliment, MD, have reviewed all documentation for this visit. The documentation on 08/16/22 for the exam, diagnosis, procedures, and orders are all accurate and complete.

## 2022-08-23 ENCOUNTER — Encounter (HOSPITAL_BASED_OUTPATIENT_CLINIC_OR_DEPARTMENT_OTHER): Payer: Self-pay | Admitting: Surgery

## 2022-08-23 ENCOUNTER — Other Ambulatory Visit: Payer: Self-pay

## 2022-08-30 ENCOUNTER — Ambulatory Visit
Admission: RE | Admit: 2022-08-30 | Discharge: 2022-08-30 | Disposition: A | Discharge status: Home / Self Care | Payer: PRIVATE HEALTH INSURANCE | Source: Ambulatory Visit | Attending: Surgery | Admitting: Surgery

## 2022-08-30 ENCOUNTER — Ambulatory Visit
Admission: RE | Admit: 2022-08-30 | Discharge: 2022-08-30 | Disposition: A | Discharge status: Home / Self Care | Payer: PRIVATE HEALTH INSURANCE | Source: Ambulatory Visit | Attending: Obstetrics and Gynecology | Admitting: Obstetrics and Gynecology

## 2022-08-30 DIAGNOSIS — N6092 Unspecified benign mammary dysplasia of left breast: Secondary | ICD-10-CM

## 2022-08-30 HISTORY — PX: BREAST BIOPSY: SHX20

## 2022-08-30 MED ORDER — CHLORHEXIDINE GLUCONATE CLOTH 2 % EX PADS: 6.0000 | MEDICATED_PAD | Freq: Once | CUTANEOUS | Status: DC

## 2022-08-30 MED ORDER — ENSURE PRE-SURGERY PO LIQD: 296.0000 mL | Freq: Once | ORAL | Status: DC

## 2022-08-30 NOTE — H&P (Addendum)
REFERRING PHYSICIAN: Wynona Dove* PROVIDER: Wayne Both, MD MRN: [id number] DOB: Jan 20, 1976  Subjective  Chief Complaint: NEW BREAST (Left breast)  History of Present Illness: Diana Craig is a 47 y.o. female who is seen as an office consultation for evaluation of NEW BREAST (Left breast)  This is a very pleasant 47 year old female who had undergone screening mammograms. She had multiple suspicious appearing areas in her left breast. She ended up having to have multiple biopsies numbering 5 in total. 2 biopsies showed atypical lobular hyperplasia. All the other biopsies were benign. She has had no previous problems regarding her breast. She denies nipple discharge. There is no family history of breast cancer. She is otherwise healthy and without complaints. She reports she tolerated the biopsies fairly well  Review of Systems: A complete review of systems was obtained from the patient. I have reviewed this information and discussed as appropriate with the patient. See HPI as well for other ROS.  ROS  Medical History: Past Medical History: Diagnosis Date Anemia Arthritis GERD (gastroesophageal reflux disease)  There is no problem list on file for this patient.  Past Surgical History: Procedure Laterality Date MYOMECTOMY SURGERY 2014 KNEE SURGERY 07/2021 ABLATION SURGERY 01/2022 BUNIONECTOMY 2008, 2016   No Known Allergies  Current Outpatient Medications on File Prior to Visit Medication Sig Dispense Refill acetaminophen (TYLENOL) 500 MG tablet Take 500 mg by mouth every 6 (six) hours as needed cholecalciferol (VITAMIN D3) 2,000 unit tablet Take 2,000 Units by mouth once daily docusate (COLACE) 100 MG capsule Take by mouth ibuprofen (MOTRIN) 800 MG tablet TAKE 1 TABLET(S) ORAL EVERY EIGHT HOURS AS NEEDED FOR PAIN L. acidophilus/Bifid. animalis 10 billion cell Cap Take 1 capsule by mouth once daily MULTIVITAMIN ORAL Take 1 capsule by mouth once  daily WEGOVY 1.7 mg/0.75 mL pen injector  No current facility-administered medications on file prior to visit.  History reviewed. No pertinent family history.  Social History  Tobacco Use Smoking Status Never Smokeless Tobacco Never   Social History  Socioeconomic History Marital status: Married Tobacco Use Smoking status: Never Smokeless tobacco: Never Substance and Sexual Activity Alcohol use: Yes Drug use: Never  Social Determinants of Health  Financial Resource Strain: Low Risk (06/24/2022) Received from [facility] Overall Financial Resource Strain (CARDIA) Difficulty of Paying Living Expenses: Not hard at all Food Insecurity: No Food Insecurity (06/24/2022) Received from [facility] Hunger Vital Sign Worried About Running Out of Food in the Last Year: Never true Ran Out of Food in the Last Year: Never true Transportation Needs: No Transportation Needs (06/24/2022) Received from [facility] - Transportation Lack of Transportation (Medical): No Lack of Transportation (Non-Medical): No Physical Activity: Insufficiently Active (06/24/2022) Received from [facility] Exercise Vital Sign Days of Exercise per Week: 3 days Minutes of Exercise per Session: 40 min Stress: Stress Concern Present (06/24/2022) Received from [facility] - Occupational Stress Questionnaire Feeling of Stress : Rather much Social Connections: Socially Integrated (06/24/2022) Received from [facility] Social Connection and Isolation Panel [NHANES] Frequency of Communication with Friends and Family: Three times a week Frequency of Social Gatherings with Friends and Family: Once a week Attends Religious Services: 1 to 4 times per year Active Member of [employer] or Organizations: Yes Attends Engineer, structural: More than 4 times per year Marital Status: Married  Objective:  Vitals:  BP: 112/76 Pulse: 61 Temp: 36.5 C (97.7 F) SpO2:  98% Weight: 76.6 kg (168 lb 12.8 oz) Height: 163.8  cm (5' 4.5") PainSc: 0-No pain PainLoc: Breast  Body mass index is 28.53 kg/m.  Physical Exam  She appears well on exam  The left breast shows multiple sites from the biopsy with minimal bruising. There are no specific palpable masses. 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 She does have 2 areas of atypical lobular hyperplasia with removal of both these areas recommended. I explained the reasonings for this with her in detail including ruling out early malignancies. I next explained proceeding with a radioactive seed guided left breast lumpectomy x 2. I explained the surgical procedure in detail. I discussed the risks which includes but is not limited to bleeding, infection, injury to surrounding structures, the need for further surgery if malignancy is found, cardiopulmonary issues with surgery, postoperative recovery, etc. She understands and wishes to proceed with surgery which will be scheduled  Addendum:  I discussed seed placement with radiology.  There are 3 places that need removal.  Two are so close together that radiology will place one seed between these two clips and a second seed at the 3rd clip

## 2022-08-30 NOTE — Progress Notes (Signed)

## 2022-08-31 ENCOUNTER — Encounter (HOSPITAL_BASED_OUTPATIENT_CLINIC_OR_DEPARTMENT_OTHER)
Admission: RE | Disposition: A | Discharge status: Home / Self Care | Payer: Self-pay | Source: Home / Self Care | Attending: Surgery

## 2022-08-31 ENCOUNTER — Ambulatory Visit (HOSPITAL_BASED_OUTPATIENT_CLINIC_OR_DEPARTMENT_OTHER)
Admission: RE | Admit: 2022-08-31 | Discharge: 2022-08-31 | Disposition: A | Discharge status: Home / Self Care | Payer: PRIVATE HEALTH INSURANCE | Attending: Surgery | Admitting: Surgery

## 2022-08-31 ENCOUNTER — Ambulatory Visit (HOSPITAL_BASED_OUTPATIENT_CLINIC_OR_DEPARTMENT_OTHER): Payer: PRIVATE HEALTH INSURANCE | Admitting: Anesthesiology

## 2022-08-31 ENCOUNTER — Other Ambulatory Visit: Payer: Self-pay

## 2022-08-31 ENCOUNTER — Encounter (HOSPITAL_BASED_OUTPATIENT_CLINIC_OR_DEPARTMENT_OTHER): Payer: Self-pay | Admitting: Surgery

## 2022-08-31 ENCOUNTER — Ambulatory Visit
Admission: RE | Admit: 2022-08-31 | Discharge: 2022-08-31 | Disposition: A | Discharge status: Home / Self Care | Payer: PRIVATE HEALTH INSURANCE | Source: Ambulatory Visit | Attending: Surgery | Admitting: Surgery

## 2022-08-31 DIAGNOSIS — Z7985 Long-term (current) use of injectable non-insulin antidiabetic drugs: Secondary | ICD-10-CM | POA: Diagnosis not present

## 2022-08-31 DIAGNOSIS — Z01818 Encounter for other preprocedural examination: Secondary | ICD-10-CM

## 2022-08-31 DIAGNOSIS — K219 Gastro-esophageal reflux disease without esophagitis: Secondary | ICD-10-CM | POA: Insufficient documentation

## 2022-08-31 DIAGNOSIS — N62 Hypertrophy of breast: Secondary | ICD-10-CM | POA: Diagnosis present

## 2022-08-31 DIAGNOSIS — Z79899 Other long term (current) drug therapy: Secondary | ICD-10-CM | POA: Insufficient documentation

## 2022-08-31 DIAGNOSIS — F419 Anxiety disorder, unspecified: Secondary | ICD-10-CM

## 2022-08-31 DIAGNOSIS — N6092 Unspecified benign mammary dysplasia of left breast: Secondary | ICD-10-CM | POA: Insufficient documentation

## 2022-08-31 HISTORY — PX: BREAST LUMPECTOMY WITH RADIOACTIVE SEED LOCALIZATION: SHX6424

## 2022-08-31 LAB — POCT PREGNANCY, URINE: Preg Test, Ur: NEGATIVE

## 2022-08-31 SURGERY — BREAST LUMPECTOMY WITH RADIOACTIVE SEED LOCALIZATION
Anesthesia: General | Site: Breast | Laterality: Left

## 2022-08-31 MED ORDER — EPHEDRINE SULFATE (PRESSORS) 50 MG/ML IJ SOLN
INTRAMUSCULAR | Status: DC | PRN
  Administered 2022-08-31 (×3): 5 mg via INTRAVENOUS

## 2022-08-31 MED ORDER — SCOPOLAMINE 1 MG/3DAYS TD PT72
MEDICATED_PATCH | TRANSDERMAL | Status: DC | PRN
  Administered 2022-08-31: 1 via TRANSDERMAL

## 2022-08-31 MED ORDER — MEPERIDINE HCL 25 MG/ML IJ SOLN: 6.2500 mg | INTRAMUSCULAR | Status: DC | PRN

## 2022-08-31 MED ORDER — LACTATED RINGERS IV SOLN: INTRAVENOUS | Status: DC

## 2022-08-31 MED ORDER — LIDOCAINE HCL (CARDIAC) PF 100 MG/5ML IV SOSY
PREFILLED_SYRINGE | INTRAVENOUS | Status: DC | PRN
  Administered 2022-08-31: 60 mg via INTRAVENOUS

## 2022-08-31 MED ORDER — OXYCODONE HCL 5 MG PO TABS: 5.0000 mg | ORAL_TABLET | Freq: Once | ORAL | Status: DC | PRN

## 2022-08-31 MED ORDER — FENTANYL CITRATE (PF) 100 MCG/2ML IJ SOLN
INTRAMUSCULAR | Status: AC
  Filled 2022-08-31: qty 2

## 2022-08-31 MED ORDER — HYDROMORPHONE HCL 1 MG/ML IJ SOLN
0.2500 mg | INTRAMUSCULAR | Status: DC | PRN
  Administered 2022-08-31: 0.5 mg via INTRAVENOUS

## 2022-08-31 MED ORDER — KETOROLAC TROMETHAMINE 30 MG/ML IJ SOLN
30.0000 mg | Freq: Once | INTRAMUSCULAR | Status: AC | PRN
  Administered 2022-08-31: 30 mg via INTRAVENOUS

## 2022-08-31 MED ORDER — DEXAMETHASONE SODIUM PHOSPHATE 10 MG/ML IJ SOLN
INTRAMUSCULAR | Status: DC | PRN
  Administered 2022-08-31: 10 mg via INTRAVENOUS

## 2022-08-31 MED ORDER — ACETAMINOPHEN 500 MG PO TABS
ORAL_TABLET | ORAL | Status: AC
  Filled 2022-08-31: qty 2

## 2022-08-31 MED ORDER — BUPIVACAINE-EPINEPHRINE (PF) 0.25% -1:200000 IJ SOLN
INTRAMUSCULAR | Status: AC
  Filled 2022-08-31: qty 30

## 2022-08-31 MED ORDER — TRAMADOL HCL 50 MG PO TABS: 50.0000 mg | ORAL_TABLET | Freq: Four times a day (QID) | ORAL | 0 refills | Status: DC | PRN

## 2022-08-31 MED ORDER — ACETAMINOPHEN 500 MG PO TABS
1000.0000 mg | ORAL_TABLET | Freq: Once | ORAL | Status: AC
  Administered 2022-08-31: 1000 mg via ORAL

## 2022-08-31 MED ORDER — OXYCODONE HCL 5 MG/5ML PO SOLN: 5.0000 mg | Freq: Once | ORAL | Status: DC | PRN

## 2022-08-31 MED ORDER — ONDANSETRON HCL 4 MG/2ML IJ SOLN
INTRAMUSCULAR | Status: AC
  Filled 2022-08-31: qty 2

## 2022-08-31 MED ORDER — PHENYLEPHRINE HCL (PRESSORS) 10 MG/ML IV SOLN
INTRAVENOUS | Status: DC | PRN
  Administered 2022-08-31 (×3): 80 ug via INTRAVENOUS
  Administered 2022-08-31: 160 ug via INTRAVENOUS

## 2022-08-31 MED ORDER — ONDANSETRON HCL 4 MG/2ML IJ SOLN: 4.0000 mg | Freq: Once | INTRAMUSCULAR | Status: DC | PRN

## 2022-08-31 MED ORDER — PROPOFOL 10 MG/ML IV BOLUS
INTRAVENOUS | Status: AC
  Filled 2022-08-31: qty 20

## 2022-08-31 MED ORDER — CEFAZOLIN SODIUM-DEXTROSE 2-4 GM/100ML-% IV SOLN
2.0000 g | INTRAVENOUS | Status: AC
  Administered 2022-08-31: 2 g via INTRAVENOUS

## 2022-08-31 MED ORDER — LIDOCAINE 2% (20 MG/ML) 5 ML SYRINGE
INTRAMUSCULAR | Status: AC
  Filled 2022-08-31: qty 5

## 2022-08-31 MED ORDER — EPHEDRINE 5 MG/ML INJ
INTRAVENOUS | Status: AC
  Filled 2022-08-31: qty 5

## 2022-08-31 MED ORDER — DEXAMETHASONE SODIUM PHOSPHATE 10 MG/ML IJ SOLN
INTRAMUSCULAR | Status: AC
  Filled 2022-08-31: qty 1

## 2022-08-31 MED ORDER — KETOROLAC TROMETHAMINE 30 MG/ML IJ SOLN
INTRAMUSCULAR | Status: AC
  Filled 2022-08-31: qty 1

## 2022-08-31 MED ORDER — AMISULPRIDE (ANTIEMETIC) 5 MG/2ML IV SOLN: 10.0000 mg | Freq: Once | INTRAVENOUS | Status: DC | PRN

## 2022-08-31 MED ORDER — ACETAMINOPHEN 500 MG PO TABS: 1000.0000 mg | ORAL_TABLET | ORAL | Status: DC

## 2022-08-31 MED ORDER — ONDANSETRON HCL 4 MG/2ML IJ SOLN
INTRAMUSCULAR | Status: DC | PRN
  Administered 2022-08-31: 4 mg via INTRAVENOUS

## 2022-08-31 MED ORDER — FENTANYL CITRATE (PF) 100 MCG/2ML IJ SOLN
INTRAMUSCULAR | Status: DC | PRN
  Administered 2022-08-31 (×2): 50 ug via INTRAVENOUS

## 2022-08-31 MED ORDER — CEFAZOLIN SODIUM-DEXTROSE 2-4 GM/100ML-% IV SOLN
INTRAVENOUS | Status: AC
  Filled 2022-08-31: qty 100

## 2022-08-31 MED ORDER — HYDROMORPHONE HCL 1 MG/ML IJ SOLN
INTRAMUSCULAR | Status: AC
  Filled 2022-08-31: qty 0.5

## 2022-08-31 MED ORDER — MIDAZOLAM HCL 2 MG/2ML IJ SOLN
INTRAMUSCULAR | Status: AC
  Filled 2022-08-31: qty 2

## 2022-08-31 MED ORDER — PROPOFOL 10 MG/ML IV BOLUS
INTRAVENOUS | Status: DC | PRN
  Administered 2022-08-31: 200 mg via INTRAVENOUS

## 2022-08-31 MED ORDER — BUPIVACAINE-EPINEPHRINE 0.25% -1:200000 IJ SOLN
INTRAMUSCULAR | Status: DC | PRN
  Administered 2022-08-31: 20 mL

## 2022-08-31 MED ORDER — SCOPOLAMINE 1 MG/3DAYS TD PT72
MEDICATED_PATCH | TRANSDERMAL | Status: AC
  Filled 2022-08-31: qty 1

## 2022-08-31 MED ORDER — MIDAZOLAM HCL 5 MG/5ML IJ SOLN
INTRAMUSCULAR | Status: DC | PRN
  Administered 2022-08-31: 2 mg via INTRAVENOUS

## 2022-08-31 SURGICAL SUPPLY — 47 items
ADH SKN CLS APL DERMABOND .7 (GAUZE/BANDAGES/DRESSINGS) ×1
APL PRP STRL LF DISP 70% ISPRP (MISCELLANEOUS) ×1
APPLIER CLIP 9.375 MED OPEN (MISCELLANEOUS)
APR CLP MED 9.3 20 MLT OPN (MISCELLANEOUS)
BINDER BREAST 3XL (GAUZE/BANDAGES/DRESSINGS) IMPLANT
BINDER BREAST LRG (GAUZE/BANDAGES/DRESSINGS) IMPLANT
BINDER BREAST MEDIUM (GAUZE/BANDAGES/DRESSINGS) IMPLANT
BINDER BREAST XLRG (GAUZE/BANDAGES/DRESSINGS) IMPLANT
BINDER BREAST XXLRG (GAUZE/BANDAGES/DRESSINGS) IMPLANT
BLADE SURG 15 STRL LF DISP TIS (BLADE) ×1 IMPLANT
BLADE SURG 15 STRL SS (BLADE) ×1
CANISTER SUC SOCK COL 7IN (MISCELLANEOUS) IMPLANT
CANISTER SUCT 1200ML W/VALVE (MISCELLANEOUS) IMPLANT
CHLORAPREP W/TINT 26 (MISCELLANEOUS) ×1 IMPLANT
CLIP APPLIE 9.375 MED OPEN (MISCELLANEOUS) IMPLANT
COVER BACK TABLE 60X90IN (DRAPES) ×1 IMPLANT
COVER MAYO STAND STRL (DRAPES) ×1 IMPLANT
COVER PROBE CYLINDRICAL 5X96 (MISCELLANEOUS) ×1 IMPLANT
DERMABOND ADVANCED .7 DNX12 (GAUZE/BANDAGES/DRESSINGS) ×1 IMPLANT
DRAPE LAPAROSCOPIC ABDOMINAL (DRAPES) ×1 IMPLANT
DRAPE UTILITY XL STRL (DRAPES) ×1 IMPLANT
ELECT REM PT RETURN 9FT ADLT (ELECTROSURGICAL) ×1
ELECTRODE REM PT RTRN 9FT ADLT (ELECTROSURGICAL) ×1 IMPLANT
GAUZE SPONGE 4X4 12PLY STRL LF (GAUZE/BANDAGES/DRESSINGS) IMPLANT
GLOVE SURG SIGNA 7.5 PF LTX (GLOVE) ×1 IMPLANT
GOWN STRL REUS W/ TWL LRG LVL3 (GOWN DISPOSABLE) ×1 IMPLANT
GOWN STRL REUS W/ TWL XL LVL3 (GOWN DISPOSABLE) ×1 IMPLANT
GOWN STRL REUS W/TWL LRG LVL3 (GOWN DISPOSABLE) ×1
GOWN STRL REUS W/TWL XL LVL3 (GOWN DISPOSABLE) ×1
KIT MARKER MARGIN INK (KITS) ×1 IMPLANT
NDL HYPO 25X1 1.5 SAFETY (NEEDLE) ×1 IMPLANT
NEEDLE HYPO 25X1 1.5 SAFETY (NEEDLE) ×1 IMPLANT
NS IRRIG 1000ML POUR BTL (IV SOLUTION) IMPLANT
PACK BASIN DAY SURGERY FS (CUSTOM PROCEDURE TRAY) ×1 IMPLANT
PENCIL SMOKE EVACUATOR (MISCELLANEOUS) ×1 IMPLANT
SLEEVE SCD COMPRESS KNEE MED (STOCKING) ×1 IMPLANT
SPIKE FLUID TRANSFER (MISCELLANEOUS) IMPLANT
SPONGE T-LAP 4X18 ~~LOC~~+RFID (SPONGE) ×1 IMPLANT
SUT MNCRL AB 4-0 PS2 18 (SUTURE) ×1 IMPLANT
SUT SILK 2 0 SH (SUTURE) IMPLANT
SUT VIC AB 3-0 SH 27 (SUTURE) ×1
SUT VIC AB 3-0 SH 27X BRD (SUTURE) ×1 IMPLANT
SYR CONTROL 10ML LL (SYRINGE) ×1 IMPLANT
TOWEL GREEN STERILE FF (TOWEL DISPOSABLE) ×1 IMPLANT
TRAY FAXITRON CT DISP (TRAY / TRAY PROCEDURE) ×1 IMPLANT
TUBE CONNECTING 20X1/4 (TUBING) IMPLANT
YANKAUER SUCT BULB TIP NO VENT (SUCTIONS) IMPLANT

## 2022-08-31 NOTE — Discharge Instructions (Addendum)
[facility] Office Phone Number [phone]  BREAST BIOPSY/ PARTIAL MASTECTOMY: POST OP INSTRUCTIONS  Always review your discharge instruction sheet given to you by the facility where your surgery was performed.  IF YOU HAVE DISABILITY OR FAMILY LEAVE FORMS, YOU MUST BRING THEM TO THE OFFICE FOR PROCESSING.  DO NOT GIVE THEM TO YOUR DOCTOR.  A prescription for pain medication may be given to you upon discharge.  Take your pain medication as prescribed, if needed.  If narcotic pain medicine is not needed, then you may take acetaminophen (Tylenol) or ibuprofen (Advil) as needed. Take your usually prescribed medications unless otherwise directed If you need a refill on your pain medication, please contact your pharmacy.  They will contact our office to request authorization.  Prescriptions will not be filled after 5pm or on week-ends. You should eat very light the first 24 hours after surgery, such as soup, crackers, pudding, etc.  Resume your normal diet the day after surgery. Most patients will experience some swelling and bruising in the breast.  Ice packs and a good support bra will help.  Swelling and bruising can take several days to resolve.  It is common to experience some constipation if taking pain medication after surgery.  Increasing fluid intake and taking a stool softener will usually help or prevent this problem from occurring.  A mild laxative (Milk of Magnesia or Miralax) should be taken according to package directions if there are no bowel movements after 48 hours. Unless discharge instructions indicate otherwise, you may remove your bandages 24-48 hours after surgery, and you may shower at that time.  You may have steri-strips (small skin tapes) in place directly over the incision.  These strips should be left on the skin for 7-10 days.  If your surgeon used skin glue on the incision, you may shower in 24 hours.  The glue will flake off over the next 2-3 weeks.  Any  sutures or staples will be removed at the office during your follow-up visit. ACTIVITIES:  You may resume regular daily activities (gradually increasing) beginning the next day.  Wearing a good support bra or sports bra minimizes pain and swelling.  You may have sexual intercourse when it is comfortable. You may drive when you no longer are taking prescription pain medication, you can comfortably wear a seatbelt, and you can safely maneuver your car and apply brakes. RETURN TO WORK:  ______________________________________________________________________________________ Diana Craig should see your doctor in the office for a follow-up appointment approximately two weeks after your surgery.  Your doctor's nurse will typically make your follow-up appointment when she calls you with your pathology report.  Expect your pathology report 2-3 business days after your surgery.  You may call to check if you do not hear from [place] after three days. OTHER INSTRUCTIONS: _YOU MAY REMOVE THE BINDER AND SHOWER STARTING TOMORROW THEN WEAR WHAT MAKES YOU THE MOST COMFORTABLE ICE PACK, TYLENOL, AND IBUPROFEN ALSO FOR PAIN NO VIGOROUS ACTIVITY FOR ONE WEEK _____________________________________________________________________________________________ _____________________________________________________________________________________________________________________________________ _____________________________________________________________________________________________________________________________________ _____________________________________________________________________________________________________________________________________  WHEN TO CALL YOUR DOCTOR: Fever over 101.0 Nausea and/or vomiting. Extreme swelling or bruising. Continued bleeding from incision. Increased pain, redness, or drainage from the incision.  The clinic staff is available to answer your questions during regular business hours.  Please don't  hesitate to call and ask to speak to one of the nurses for clinical concerns.  If you have a medical emergency, go to the nearest emergency room or call 911.  A surgeon from [facility] is  always on call at the hospital.  For further questions, please visit [email or web address]   May take Tylenol after 5:30pm, if needed.  Post Anesthesia Home Care Instructions  Activity: Get plenty of rest for the remainder of the day. A responsible individual must stay with you for 24 hours following the procedure.  For the next 24 hours, DO NOT: -Drive a car -Advertising copywriter -Drink alcoholic beverages -Take any medication unless instructed by your physician -Make any legal decisions or sign important papers.  Meals: Start with liquid foods such as gelatin or soup. Progress to regular foods as tolerated. Avoid greasy, spicy, heavy foods. If nausea and/or vomiting occur, drink only clear liquids until the nausea and/or vomiting subsides. Call your physician if vomiting continues.  Special Instructions/Symptoms: Your throat may feel dry or sore from the anesthesia or the breathing tube placed in your throat during surgery. If this causes discomfort, gargle with warm salt water. The discomfort should disappear within 24 hours.  If you had a scopolamine patch placed behind your ear for the management of post- operative nausea and/or vomiting:  1. The medication in the patch is effective for 72 hours, after which it should be removed.  Wrap patch in a tissue and discard in the trash. Wash hands thoroughly with soap and water. 2. You may remove the patch earlier than 72 hours if you experience unpleasant side effects which may include dry mouth, dizziness or visual disturbances. 3. Avoid touching the patch. Wash your hands with soap and water after contact with the patch.

## 2022-08-31 NOTE — Anesthesia Preprocedure Evaluation (Addendum)
Anesthesia Evaluation  Patient identified by MRN, date of birth, ID band Patient awake    Reviewed: Allergy & Precautions, NPO status , Patient's Chart, lab work & pertinent test results  Airway Mallampati: I  TM Distance: >3 FB Neck ROM: Full    Dental  (+) Teeth Intact, Dental Advisory Given   Pulmonary neg pulmonary ROS   Pulmonary exam normal breath sounds clear to auscultation       Cardiovascular negative cardio ROS Normal cardiovascular exam Rhythm:Regular Rate:Normal     Neuro/Psych  Headaches PSYCHIATRIC DISORDERS Anxiety        GI/Hepatic Neg liver ROS,GERD  Controlled,,  Endo/Other  negative endocrine ROS    Renal/GU negative Renal ROS  negative genitourinary   Musculoskeletal negative musculoskeletal ROS (+)    Abdominal   Peds  Hematology negative hematology ROS (+)   Anesthesia Other Findings Wegovy- LD 6/9  Reproductive/Obstetrics negative OB ROS                             Anesthesia Physical Anesthesia Plan  ASA: 2  Anesthesia Plan: General   Post-op Pain Management: Tylenol PO (pre-op)*   Induction: Intravenous  PONV Risk Score and Plan: 3 and Ondansetron, Dexamethasone, Midazolam and Treatment may vary due to age or medical condition  Airway Management Planned: LMA  Additional Equipment: None  Intra-op Plan:   Post-operative Plan: Extubation in OR  Informed Consent: I have reviewed the patients History and Physical, chart, labs and discussed the procedure including the risks, benefits and alternatives for the proposed anesthesia with the patient or authorized representative who has indicated his/her understanding and acceptance.     Dental advisory given  Plan Discussed with: CRNA  Anesthesia Plan Comments:        Anesthesia Quick Evaluation

## 2022-08-31 NOTE — Anesthesia Postprocedure Evaluation (Signed)
Anesthesia Post Note  Patient: Diana Craig  Procedure(s) Performed: LEFT BREAST LUMPECTOMY WITH RADIOACTIVE SEED LOCALIZATION X2 (Left: Breast)     Patient location during evaluation: PACU Anesthesia Type: General Level of consciousness: awake and alert, oriented and patient cooperative Pain management: pain level controlled Vital Signs Assessment: post-procedure vital signs reviewed and stable Respiratory status: spontaneous breathing, nonlabored ventilation and respiratory function stable Cardiovascular status: blood pressure returned to baseline and stable Postop Assessment: no apparent nausea or vomiting Anesthetic complications: no   No notable events documented.  Last Vitals:  Vitals:   08/31/22 1334 08/31/22 1345  BP: 121/76 115/73  Pulse: 75 74  Resp: 20 16  Temp: (!) 36.2 C   SpO2: 100% 100%    Last Pain:  Vitals:   08/31/22 1334  TempSrc:   PainSc: Asleep                 Lannie Fields

## 2022-08-31 NOTE — Transfer of Care (Signed)
Immediate Anesthesia Transfer of Care Note  Patient: Diana Craig  Procedure(s) Performed: LEFT BREAST LUMPECTOMY WITH RADIOACTIVE SEED LOCALIZATION X2 (Left: Breast)  Patient Location: PACU  Anesthesia Type:General  Level of Consciousness: drowsy  Airway & Oxygen Therapy: Patient Spontanous Breathing and Patient connected to face mask oxygen  Post-op Assessment: Report given to RN and Post -op Vital signs reviewed and stable  Post vital signs: Reviewed and stable  Last Vitals:  Vitals Value Taken Time  BP 121/76 08/31/22 1334  Temp    Pulse 84 08/31/22 1336  Resp 18 08/31/22 1336  SpO2 98 % 08/31/22 1336  Vitals shown include unvalidated device data.  Last Pain:  Vitals:   08/31/22 1130  TempSrc: Oral  PainSc: 0-No pain      Patients Stated Pain Goal: 3 (08/31/22 1130)  Complications: No notable events documented.

## 2022-08-31 NOTE — Interval H&P Note (Signed)
History and Physical Interval Note: no change in H and P  08/31/2022 12:12 PM  Leora N Bradby  has presented today for surgery, with the diagnosis of LEFT BREAST ATYPICAL LOBULAR HYPERPLASIA X2.  The various methods of treatment have been discussed with the patient and family. After consideration of risks, benefits and other options for treatment, the patient has consented to  Procedure(s): LEFT BREAST LUMPECTOMY WITH RADIOACTIVE SEED LOCALIZATION X2 (Left) as a surgical intervention.  The patient's history has been reviewed, patient examined, no change in status, stable for surgery.  I have reviewed the patient's chart and labs.  Questions were answered to the patient's satisfaction.     Abigail Miyamoto

## 2022-08-31 NOTE — Anesthesia Procedure Notes (Signed)
Procedure Name: LMA Insertion Date/Time: 08/31/2022 12:25 PM  Performed by: Lauralyn Primes, CRNAPre-anesthesia Checklist: Patient identified, Emergency Drugs available, Suction available and Patient being monitored Patient Re-evaluated:Patient Re-evaluated prior to induction Oxygen Delivery Method: Circle system utilized Preoxygenation: Pre-oxygenation with 100% oxygen Induction Type: IV induction Ventilation: Mask ventilation without difficulty LMA: LMA inserted LMA Size: 4.0 Number of attempts: 1 Airway Equipment and Method: Bite block Placement Confirmation: positive ETCO2 Tube secured with: Tape Dental Injury: Teeth and Oropharynx as per pre-operative assessment

## 2022-08-31 NOTE — Op Note (Signed)
   Diana Craig 08/31/2022   Pre-op Diagnosis: LEFT BREAST ATYPICAL LOBULAR HYPERPLASIA MULTIPLE SITES     Post-op Diagnosis: same  Procedure(s): LEFT BREAST LUMPECTOMY WITH RADIOACTIVE SEED LOCALIZATION X2  Surgeon(s): Abigail Miyamoto, MD Carollee Herter, MD [facility] Resident  Anesthesia: General  Staff:  Circulator: Janace Aris, RN Scrub Person: Wardell Heath, CST  Estimated Blood Loss: Minimal               Specimens: sent to path  Indications: This is a 47 year old female who had multiple abnormalities on her left breast on mammogram.  She underwent 5 biopsies of the left breast.  3 areas showed atypical cells and it was recommended in 3 years to be removed.  2 areas are close together so there will be 2 radioactive seeds placed to remove 3 separate areas  Procedure: The patient was brought to the operating room and identified the correct patient.  She was placed supine on the operating room table and general anesthesia was induced.  Her left breast was prepped and draped in usual sterile fashion.  We anesthetized the skin at the lower edge of the areola going slightly laterally with pain.  We then made a circumareolar incision with a scalpel.  With the neoprobe we located the first radioactive seed at the 6 o'clock position.  We performed a small lumpectomy in this area staying widely around the radioactive seed with the cautery.  Once the specimen was removed and x-rays performed confirming that the radioactive seed and heart-shaped clip were in the specimen.  All margins were marked with paint and it was sent to pathology.  The second clip was centered between 2 suspicious areas at the 5 o'clock position of the left breast.  We dissected this area circumferentially with the aid of the neoprobe and the cautery.  We then completed the second lumpectomy with the cautery.  We again marked the margins with paint.  An x-ray confirmed that the radioactive seed and the ribbon and  x-ray shaped clip were in the specimen.  The second specimen was then sent to pathology as well.  We achieved hemostasis with cautery.  We injected further Marcaine into the incision.  We then reapproximated some of her deep breast tissue with interrupted 3-0 Vicryl sutures.  The subcutaneous tissue was then closed interrupted 3-0 Vicryl sutures and the skin was closed with a running 4-0 Monocryl.  Dermabond was then applied.  The patient tolerated the procedure well.  All counts were correct at the end of the procedure.  The patient was then extubated in the operating room and taken in stable condition to the recovery room.          Abigail Miyamoto   Date: 08/31/2022  Time: 1:19 PM

## 2022-09-01 ENCOUNTER — Encounter (HOSPITAL_BASED_OUTPATIENT_CLINIC_OR_DEPARTMENT_OTHER): Payer: Self-pay | Admitting: Surgery

## 2022-09-05 LAB — SURGICAL PATHOLOGY

## 2022-09-19 ENCOUNTER — Other Ambulatory Visit: Payer: Self-pay | Admitting: Internal Medicine

## 2022-09-19 MED ORDER — ZOLPIDEM TARTRATE 5 MG PO TABS: 5.0000 mg | ORAL_TABLET | Freq: Every evening | ORAL | 0 refills | Status: DC | PRN

## 2022-09-19 MED ORDER — SCOPOLAMINE 1 MG/3DAYS TD PT72: 1.0000 | MEDICATED_PATCH | TRANSDERMAL | 1 refills | Status: DC

## 2022-10-25 ENCOUNTER — Encounter: Payer: Self-pay | Admitting: Internal Medicine

## 2022-10-25 ENCOUNTER — Other Ambulatory Visit: Payer: Self-pay | Admitting: Internal Medicine

## 2022-10-25 ENCOUNTER — Telehealth: Payer: PRIVATE HEALTH INSURANCE | Admitting: Internal Medicine

## 2022-10-25 DIAGNOSIS — E663 Overweight: Secondary | ICD-10-CM | POA: Diagnosis not present

## 2022-10-25 DIAGNOSIS — R6882 Decreased libido: Secondary | ICD-10-CM

## 2022-10-25 DIAGNOSIS — Z6826 Body mass index (BMI) 26.0-26.9, adult: Secondary | ICD-10-CM

## 2022-10-25 DIAGNOSIS — N951 Menopausal and female climacteric states: Secondary | ICD-10-CM | POA: Diagnosis not present

## 2022-10-25 DIAGNOSIS — Z6828 Body mass index (BMI) 28.0-28.9, adult: Secondary | ICD-10-CM

## 2022-10-25 DIAGNOSIS — Z6829 Body mass index (BMI) 29.0-29.9, adult: Secondary | ICD-10-CM

## 2022-10-25 MED ORDER — ZOLPIDEM TARTRATE 5 MG PO TABS: 5.0000 mg | ORAL_TABLET | Freq: Every evening | ORAL | 2 refills | Status: DC | PRN

## 2022-10-25 MED ORDER — WEGOVY 1.7 MG/0.75ML ~~LOC~~ SOAJ: 1.7000 mg | SUBCUTANEOUS | 2 refills | Status: DC

## 2022-10-25 NOTE — Patient Instructions (Signed)

## 2022-10-25 NOTE — Progress Notes (Signed)
Virtual Visit via Video   This visit type was conducted due to national recommendations for restrictions regarding the COVID-19 Pandemic (e.g. social distancing) in an effort to limit this patient's exposure and mitigate transmission in our community.  Due to her co-morbid illnesses, this patient is at least at moderate risk for complications without adequate follow up.  This format is felt to be most appropriate for this patient at this time.  All issues noted in this document were discussed and addressed.  A limited physical exam was performed with this format.    This visit type was conducted due to national recommendations for restrictions regarding the COVID-19 Pandemic (e.g. social distancing) in an effort to limit this patient's exposure and mitigate transmission in our community.  Patients identity confirmed using two different identifiers.  This format is felt to be most appropriate for this patient at this time.  All issues noted in this document were discussed and addressed.  No physical exam was performed (except for noted visual exam findings with Video Visits).    Date:  11/09/2022   ID:  Diana Craig, DOB 08/29/1975, MRN [id number]  Patient Location:  Mother's home in [place], private room  Provider location:   Office    Chief Complaint:  "I have weight check"  History of Present Illness:    Diana Craig is a 47 y.o. female who presents via video conferencing for a telehealth visit today.    The patient does not have symptoms concerning for COVID-19 infection (fever, chills, cough, or new shortness of breath).   Patient presents today for a weight check. Patient reports compliance with XBJYNW. Patient does not have any questions or concerns at this time. She is still exercising on a regular basis. She has reached her goal weight. 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      Past Medical History:  Diagnosis Date    Anxiety    GERD (gastroesophageal reflux disease)    Headache(784.0)    menstrual cycle related   IDA (iron deficiency anemia)    Uterine fibroid    Past Surgical History:  Procedure Laterality Date   BREAST BIOPSY Left 06/03/2022   MM LT BREAST BX W LOC DEV 1ST LESION IMAGE BX SPEC STEREO GUIDE 06/03/2022 [facility]   BREAST BIOPSY Left 06/21/2022   [place] LT BREAST BX W LOC DEV EA ADD LESION IMG BX SPEC US GUIDE 06/21/2022 [facility]   BREAST BIOPSY Left 06/21/2022   [place] LT BREAST BX W LOC DEV EA ADD LESION IMG BX SPEC US GUIDE 06/21/2022 [facility]   BREAST BIOPSY Left 06/21/2022   [place] LT BREAST BX W LOC DEV 1ST LESION IMG BX SPEC US GUIDE 06/21/2022 [facility]   BREAST BIOPSY Left 07/05/2022   MM LT BREAST BX W LOC DEV 1ST LESION IMAGE BX SPEC STEREO GUIDE 07/05/2022 [facility]   BREAST BIOPSY  08/30/2022   MM LT RADIOACTIVE SEED LOC MAMMO GUIDE 08/30/2022 [facility]   BREAST BIOPSY Left 06/27/2022   MM LT BREAST BX W LOC DEV 1ST LESION IMAGE BX SPEC STEREO GUIDE 06/27/2022 [facility]   BREAST BIOPSY  08/30/2022   MM LT RADIOACTIVE SEED EA ADD LESION LOC MAMMO GUIDE 08/30/2022 [facility]   BREAST LUMPECTOMY WITH RADIOACTIVE SEED LOCALIZATION Left 08/31/2022   Procedure: LEFT BREAST LUMPECTOMY WITH RADIOACTIVE SEED LOCALIZATION X2;  Surgeon: Abigail Miyamoto, MD;  Location: [facility];  Service: General;  Laterality: Left;   BUNIONECTOMY Bilateral    right 2008  and left 2010   DILATATION & CURETTAGE/HYSTEROSCOPY WITH MYOSURE N/A 01/07/2022   Procedure: DILATATION & CURETTAGE/HYSTEROSCOPY WITH MYOSURE;  Surgeon: Maxie Better, MD;  Location: [facility];  Service: Gynecology;  Laterality: N/A;   DILATION AND CURETTAGE OF UTERUS     x2   last one 2014   KNEE ARTHROSCOPY W/ MENISCAL REPAIR Left 07/23/2021   [facility]   MYOMECTOMY N/A 02/14/2013   Procedure: Exploratory Laparotomy  MYOMECTOMY;  Surgeon: Serita Kyle, MD;  Location: [facility];  Service: Gynecology;  Laterality: N/A;  2 1/2 hrs.     No outpatient medications have been marked as taking for the 10/25/22 encounter (Video Visit) with Dorothyann Peng, MD.     Allergies:   Patient has no known allergies.   Social History   Tobacco Use   Smoking status: Never   Smokeless tobacco: Never   Tobacco comments:    n/a  Vaping Use   Vaping status: Never Used  Substance Use Topics   Alcohol use: Yes    Comment: social   Drug use: Never     Family Hx: The patient's family history includes COPD in her mother; Hypertension in her mother; Other in her father. There is no history of Breast cancer.  ROS:   Please see the history of present illness.    Review of Systems  Constitutional: Negative.   HENT: Negative.    Respiratory: Negative.    Cardiovascular: Negative.   Gastrointestinal: Negative.   Genitourinary:        She c/o low libido. She is not sure what is contributing to her sx. She thinks it could be due to her hormones. She is currently married.   Neurological: Negative.   Endo/Heme/Allergies: Negative.   Psychiatric/Behavioral: Negative.      All other systems reviewed and are negative.   Labs/Other Tests and Data Reviewed:    Recent Labs: 08/16/2022: ALT 17; BUN 11; Creatinine, Ser 0.72; Hemoglobin 12.7; Platelets 259; Potassium 4.1; Sodium 137; TSH 0.927   Recent Lipid Panel Lab Results  Component Value Date/Time   CHOL 171 08/16/2022 10:28 AM   TRIG 43 08/16/2022 10:28 AM   HDL 68 08/16/2022 10:28 AM   CHOLHDL 2.5 08/16/2022 10:28 AM   LDLCALC 94 08/16/2022 10:28 AM    Wt Readings from Last 3 Encounters:  08/31/22 166 lb 14.2 oz (75.7 kg)  08/16/22 166 lb 12.8 oz (75.7 kg)  06/28/22 168 lb 6.4 oz (76.4 kg)    BMI Readings from Last 3 Encounters:  08/31/22 28.65 kg/m  08/16/22 29.55 kg/m  06/28/22 29.83 kg/m     Exam:    Vital Signs:  There were no vitals taken  for this visit.    Physical Exam Vitals and nursing note reviewed.  Constitutional:      Appearance: Normal appearance.  HENT:     Head: Normocephalic and atraumatic.  Eyes:     Extraocular Movements: Extraocular movements intact.  Pulmonary:     Effort: Pulmonary effort is normal.  Musculoskeletal:     Cervical back: Normal range of motion.  Neurological:     Mental Status: She is alert and oriented to person, place, and time.  Psychiatric:        Mood and Affect: Affect normal.     ASSESSMENT & PLAN:    Overweight with body mass index (BMI) of 28 to 28.9 in adult  Assessment & Plan: She would like to stay on Wegovy 1.7mg  weekly due to her plateau. She is encouraged to resume her regular exercise program, aiming for at least 150 minutes of exercise/week.   Orders: -     Wegovy; Inject 1.7 mg into the skin once a week.  Dispense: 3 mL; Refill: 2  Low libido Assessment & Plan: We discussed use of compounded Scream Cream to help with arousal. Will check on cost of this. She is encouraged to discuss further with her GYN if needed.    Perimenopause Assessment & Plan: We discussed use of natural remedies to help with sx of insomnia, hot flashes etc. It also helps to follow a clean eating plan with limited or no alcohol.    Other orders -     Zolpidem Tartrate; Take 1 tablet (5 mg total) by mouth at bedtime as needed for sleep.  Dispense: 30 tablet; Refill: 2     COVID-19 Education: The signs and symptoms of COVID-19 were discussed with the patient and how to seek care for testing (follow up with PCP or arrange E-visit).  The importance of social distancing was discussed today.  Patient Risk:   After full review of this patients clinical status, I feel that they are at least moderate risk at this time.  Time:   Today, I have spent 20 minutes/ seconds with the patient with telehealth technology discussing above diagnoses.     Medication Adjustments/Labs and Tests  Ordered: Current medicines are reviewed at length with the patient today.  Concerns regarding medicines are outlined above.   Tests Ordered: No orders of the defined types were placed in this encounter.   Medication Changes: Meds ordered this encounter  Medications   DISCONTD: Semaglutide-Weight Management (WEGOVY) 1.7 MG/0.75ML SOAJ    Sig: Inject 1.7 mg into the skin once a week.    Dispense:  3 mL    Refill:  2   Semaglutide-Weight Management (WEGOVY) 1.7 MG/0.75ML SOAJ    Sig: Inject 1.7 mg into the skin once a week.    Dispense:  3 mL    Refill:  2   zolpidem (AMBIEN) 5 MG tablet    Sig: Take 1 tablet (5 mg total) by mouth at bedtime as needed for sleep.    Dispense:  30 tablet    Refill:  2    Disposition:  Follow up in 10 week(s)  Signed, Gwynneth Aliment, MD

## 2022-11-03 DIAGNOSIS — N951 Menopausal and female climacteric states: Secondary | ICD-10-CM | POA: Insufficient documentation

## 2022-11-03 DIAGNOSIS — R6882 Decreased libido: Secondary | ICD-10-CM | POA: Insufficient documentation

## 2022-11-06 NOTE — Assessment & Plan Note (Signed)
We discussed use of natural remedies to help with sx of insomnia, hot flashes etc. It also helps to follow a clean eating plan with limited or no alcohol.

## 2022-11-06 NOTE — Assessment & Plan Note (Signed)
She would like to stay on Wegovy 1.7mg  weekly due to her plateau. She is encouraged to resume her regular exercise program, aiming for at least 150 minutes of exercise/week.

## 2022-11-06 NOTE — Assessment & Plan Note (Signed)
We discussed use of compounded Scream Cream to help with arousal. Will check on cost of this. She is encouraged to discuss further with her GYN if needed.

## 2022-12-21 ENCOUNTER — Encounter: Payer: Self-pay | Admitting: Internal Medicine

## 2022-12-26 ENCOUNTER — Ambulatory Visit (INDEPENDENT_AMBULATORY_CARE_PROVIDER_SITE_OTHER): Payer: PRIVATE HEALTH INSURANCE | Admitting: Internal Medicine

## 2022-12-26 ENCOUNTER — Encounter: Payer: Self-pay | Admitting: Internal Medicine

## 2022-12-26 ENCOUNTER — Telehealth: Payer: Self-pay

## 2022-12-26 VITALS — BP 108/80 | HR 74 | Temp 98.2°F | Ht 64.0 in | Wt 165.8 lb

## 2022-12-26 DIAGNOSIS — Z6828 Body mass index (BMI) 28.0-28.9, adult: Secondary | ICD-10-CM | POA: Diagnosis not present

## 2022-12-26 DIAGNOSIS — E663 Overweight: Secondary | ICD-10-CM

## 2022-12-26 DIAGNOSIS — R0781 Pleurodynia: Secondary | ICD-10-CM

## 2022-12-26 DIAGNOSIS — N644 Mastodynia: Secondary | ICD-10-CM | POA: Insufficient documentation

## 2022-12-26 DIAGNOSIS — Z6829 Body mass index (BMI) 29.0-29.9, adult: Secondary | ICD-10-CM

## 2022-12-26 DIAGNOSIS — F5104 Psychophysiologic insomnia: Secondary | ICD-10-CM | POA: Diagnosis not present

## 2022-12-26 MED ORDER — ZOLPIDEM TARTRATE 5 MG PO TABS: 5.0000 mg | ORAL_TABLET | Freq: Every evening | ORAL | 2 refills | Status: DC | PRN

## 2022-12-26 MED ORDER — WEGOVY 1.7 MG/0.75ML ~~LOC~~ SOAJ: 1.7000 mg | SUBCUTANEOUS | 2 refills | Status: DC

## 2022-12-26 NOTE — Progress Notes (Signed)
I,Victoria T Deloria Lair, CMA,acting as a Neurosurgeon for Gwynneth Aliment, MD.,have documented all relevant documentation on the behalf of Gwynneth Aliment, MD,as directed by  Gwynneth Aliment, MD while in the presence of Gwynneth Aliment, MD.  Subjective:  Patient ID: Diana Craig , female    DOB: 1976/02/19 , 47 y.o.   MRN: [id number]  Chief Complaint  Patient presents with   Weight Check    HPI  Patient presents today for a weight check. Patient reports compliance with ZDGLOV. Patient does not have any questions or concerns at this time.   She adds she has been having pain underneath her left breast. Feels her rib cage is tender. She denies fall/trauma/injury. Sx started last week. Described as a dull, throbbing pain, reaching above her head exacerbates the pain. This area is also tender to touch.   She would like a refill on Ambien.      Past Medical History:  Diagnosis Date   Anxiety    GERD (gastroesophageal reflux disease)    Headache(784.0)    menstrual cycle related   IDA (iron deficiency anemia)    Uterine fibroid      Family History  Problem Relation Age of Onset   COPD Mother    Hypertension Mother    Other Father        health status unknown   Breast cancer Neg Hx      Current Outpatient Medications:    Cholecalciferol (VITAMIN D3) 50 MCG (2000 UT) TABS, Take by mouth daily., Disp: , Rfl:    docusate sodium (COLACE) 100 MG capsule, Take 100 mg by mouth daily as needed for mild constipation., Disp: , Rfl:    Ferrous Sulfate (IRON PO), Take 16 mLs by mouth daily., Disp: , Rfl:    Multiple Vitamins-Minerals (MULTIVITAMIN ADULT PO), Take by mouth 2 (two) times daily., Disp: , Rfl:    scopolamine (TRANSDERM SCOP, 1.5 MG,) 1 MG/3DAYS, Place 1 patch (1.5 mg total) onto the skin every 3 (three) days., Disp: 4 patch, Rfl: 1   Semaglutide-Weight Management (WEGOVY) 1.7 MG/0.75ML SOAJ, Inject 1.7 mg into the skin once a week., Disp: 9 mL, Rfl: 2   traMADol (ULTRAM) 50 MG  tablet, Take 1 tablet (50 mg total) by mouth every 6 (six) hours as needed for moderate pain or severe pain. (Patient not taking: Reported on 12/26/2022), Disp: 25 tablet, Rfl: 0   zolpidem (AMBIEN) 5 MG tablet, Take 1 tablet (5 mg total) by mouth at bedtime as needed for sleep., Disp: 30 tablet, Rfl: 2   No Known Allergies   Review of Systems  Constitutional: Negative.   Respiratory: Negative.    Cardiovascular: Negative.   Gastrointestinal: Negative.   Neurological: Negative.   Psychiatric/Behavioral: Negative.       Today's Vitals   12/26/22 1608  BP: 108/80  Pulse: 74  Temp: 98.2 F (36.8 C)  SpO2: 98%  Weight: 165 lb 12.8 oz (75.2 kg)  Height: 5\' 4"  (1.626 m)   Body mass index is 28.46 kg/m.  Wt Readings from Last 3 Encounters:  12/26/22 165 lb 12.8 oz (75.2 kg)  08/31/22 166 lb 14.2 oz (75.7 kg)  08/16/22 166 lb 12.8 oz (75.7 kg)     Objective:  Physical Exam Vitals and nursing note reviewed.  Constitutional:      Appearance: Normal appearance.  HENT:     Head: Normocephalic and atraumatic.  Eyes:     Extraocular Movements: Extraocular movements intact.  Cardiovascular:  Rate and Rhythm: Normal rate and regular rhythm.     Heart sounds: Normal heart sounds.  Pulmonary:     Effort: Pulmonary effort is normal.     Breath sounds: Normal breath sounds.  Chest:     Comments: No overlying erythema over left ribs. No rash noted.  Musculoskeletal:     Cervical back: Normal range of motion.  Skin:    General: Skin is warm.  Neurological:     General: No focal deficit present.     Mental Status: She is alert.  Psychiatric:        Mood and Affect: Mood normal.        Behavior: Behavior normal.         Assessment And Plan:  Overweight with body mass index (BMI) of 28 to 28.9 in adult Assessment & Plan: She has lost about 70 lbs since May 2021. Goal weight is 160lbs. She will continue with [facility] 1.7mg  weekly for now. Encouraged to resume her regular  exercise regimen. She will f/u in 10-12 weeks for re-evaluation.   Orders: -     Wegovy; Inject 1.7 mg into the skin once a week.  Dispense: 9 mL; Refill: 2  Chronic insomnia Assessment & Plan: Chronic, refill sent for zolpidem as requested. Encouraged to maintain a bedtime routine.    Rib pain on left side Assessment & Plan: Denies fall/trauma.  Advised to apply lidocaine patch to affected area daily prn. If her sx persist, may need to consider radiographic studies.    Other orders -     Zolpidem Tartrate; Take 1 tablet (5 mg total) by mouth at bedtime as needed for sleep.  Dispense: 30 tablet; Refill: 2     Return in 12 weeks (on 03/20/2023) for 2 month weight check.  Patient was given opportunity to ask questions. Patient verbalized understanding of the plan and was able to repeat key elements of the plan. All questions were answered to their satisfaction.    I, Gwynneth Aliment, MD, have reviewed all documentation for this visit. The documentation on 12/26/22 for the exam, diagnosis, procedures, and orders are all accurate and complete.   IF YOU HAVE BEEN REFERRED TO A SPECIALIST, IT MAY TAKE 1-2 WEEKS TO SCHEDULE/PROCESS THE REFERRAL. IF YOU HAVE NOT HEARD FROM US/SPECIALIST IN TWO WEEKS, PLEASE GIVE [place] A CALL AT [phone].   THE PATIENT IS ENCOURAGED TO PRACTICE SOCIAL DISTANCING DUE TO THE COVID-19 PANDEMIC.

## 2022-12-26 NOTE — Patient Instructions (Signed)

## 2022-12-26 NOTE — Telephone Encounter (Signed)
Prior Auth for [employer] 1.7mg  has been submitted through Covermymeds. We are waiting for the determination. YL,RMA

## 2023-01-01 DIAGNOSIS — R0781 Pleurodynia: Secondary | ICD-10-CM | POA: Insufficient documentation

## 2023-01-01 NOTE — Assessment & Plan Note (Signed)
She has lost about 70 lbs since May 2021. Goal weight is 160lbs. She will continue with [facility] 1.7mg  weekly for now. Encouraged to resume her regular exercise regimen. She will f/u in 10-12 weeks for re-evaluation.

## 2023-01-01 NOTE — Assessment & Plan Note (Signed)
Denies fall/trauma.  Advised to apply lidocaine patch to affected area daily prn. If her sx persist, may need to consider radiographic studies.

## 2023-01-01 NOTE — Assessment & Plan Note (Signed)
Chronic, refill sent for zolpidem as requested. Encouraged to maintain a bedtime routine.

## 2023-03-17 ENCOUNTER — Encounter: Payer: Self-pay | Admitting: Internal Medicine

## 2023-03-21 ENCOUNTER — Encounter: Payer: Self-pay | Admitting: Internal Medicine

## 2023-03-21 ENCOUNTER — Telehealth (INDEPENDENT_AMBULATORY_CARE_PROVIDER_SITE_OTHER): Payer: PRIVATE HEALTH INSURANCE | Admitting: Internal Medicine

## 2023-03-21 VITALS — Wt 168.0 lb

## 2023-03-21 DIAGNOSIS — Z6828 Body mass index (BMI) 28.0-28.9, adult: Secondary | ICD-10-CM | POA: Diagnosis not present

## 2023-03-21 DIAGNOSIS — E663 Overweight: Secondary | ICD-10-CM | POA: Diagnosis not present

## 2023-03-21 MED ORDER — WEGOVY 2.4 MG/0.75ML ~~LOC~~ SOAJ: 2.4000 mg | SUBCUTANEOUS | 1 refills | Status: DC

## 2023-03-21 NOTE — Progress Notes (Signed)
 Virtual Visit via Video Note  I,[name], CMA,acting as a neurosurgeon for Diana LOISE Slocumb, MD.,have documented all relevant documentation on the behalf of Diana LOISE Slocumb, MD,as directed by  Diana LOISE Slocumb, MD while in the presence of Diana LOISE Slocumb, MD.  I connected with Diana Craig on 04/01/23 at  4:00 PM EST by a video enabled telemedicine application and verified that I am speaking with the correct person using two identifiers.  Patient Location: Home Provider Location: Office/Clinic  I discussed the limitations, risks, security, and privacy concerns of performing an evaluation and management service by video and the availability of in person appointments. I also discussed with the patient that there may be a patient responsible charge related to this service. The patient expressed understanding and agreed to proceed.  Subjective: PCP: Craig Catheryn, MD  No chief complaint on file.  Patient presents today for a virtual visit for weight check. Patient reports compliance with Wegovy . She feels she has hit a plateau and wants to increase her dose. Patient does not have any other questions or concerns at this time.       ROS: Per HPI  Current Outpatient Medications:    Semaglutide -Weight Management (WEGOVY ) 2.4 MG/0.75ML SOAJ, Inject 2.4 mg into the skin once a week., Disp: 3 mL, Rfl: 1   Cholecalciferol (VITAMIN D3) 50 MCG (2000 UT) TABS, Take by mouth daily., Disp: , Rfl:    docusate sodium  (COLACE) 100 MG capsule, Take 100 mg by mouth daily as needed for mild constipation., Disp: , Rfl:    Ferrous Sulfate (IRON  PO), Take 16 mLs by mouth daily., Disp: , Rfl:    Multiple Vitamins-Minerals (MULTIVITAMIN ADULT PO), Take by mouth 2 (two) times daily., Disp: , Rfl:    scopolamine  (TRANSDERM SCOP , 1.5 MG,) 1 MG/3DAYS, Place 1 patch (1.5 mg total) onto the skin every 3 (three) days., Disp: 4 patch, Rfl: 1   traMADol  (ULTRAM ) 50 MG tablet, Take 1 tablet (50 mg total)  by mouth every 6 (six) hours as needed for moderate pain or severe pain. (Patient not taking: Reported on 12/26/2022), Disp: 25 tablet, Rfl: 0   zolpidem  (AMBIEN ) 5 MG tablet, Take 1 tablet (5 mg total) by mouth at bedtime as needed for sleep., Disp: 30 tablet, Rfl: 2  Observations/Objective: Today's Vitals   03/21/23 1615  Weight: 168 lb (76.2 kg)   Physical Exam Vitals and nursing note reviewed.  Constitutional:      Appearance: Normal appearance.  Eyes:     Extraocular Movements: Extraocular movements intact.  Pulmonary:     Effort: Pulmonary effort is normal.  Musculoskeletal:     Cervical back: Normal range of motion.  Neurological:     Mental Status: She is alert.    Assessment and Plan: Overweight with body mass index (BMI) of 28 to 28.9 in adult Assessment & Plan: I will increase her Wegovy  to 2.4mg  weekly.  She agrees to rto in 8 weeks for re-evaluation. She is also encouraged to aim for at least 150 minutes of exercise per week.    Other orders -     Wegovy ; Inject 2.4 mg into the skin once a week.  Dispense: 3 mL; Refill: 1   TIME IN VISIT:  minutes  Follow Up Instructions: Return in about 8 weeks (around 05/16/2023).   I discussed the assessment and treatment plan with the patient. The patient was provided an opportunity to ask questions, and all were answered. The patient agreed with  the plan and demonstrated an understanding of the instructions.   The patient was advised to call back or seek an in-person evaluation if the symptoms worsen or if the condition fails to improve as anticipated.  The above assessment and management plan was discussed with the patient. The patient verbalized understanding of and has agreed to the management plan.   I, Diana LOISE Slocumb, MD, have reviewed all documentation for this visit. The documentation on 03/21/23 for the exam, diagnosis, procedures, and orders are all accurate and complete.

## 2023-04-01 NOTE — Patient Instructions (Signed)
 Exercising to Stay Healthy To become healthy and stay healthy, it is recommended that you do moderate-intensity and vigorous-intensity exercise. You can tell that you are exercising at a moderate intensity if your heart starts beating faster and you start breathing faster but can still hold a conversation. You can tell that you are exercising at a vigorous intensity if you are breathing much harder and faster and cannot hold a conversation while exercising. How can exercise benefit me? Exercising regularly is important. It has many health benefits, such as: Improving overall fitness, flexibility, and endurance. Increasing bone density. Helping with weight control. Decreasing body fat. Increasing muscle strength and endurance. Reducing stress and tension, anxiety, depression, or anger. Improving overall health. What guidelines should I follow while exercising? Before you start a new exercise program, talk with your health care provider. Do not exercise so much that you hurt yourself, feel dizzy, or get very short of breath. Wear comfortable clothes and wear shoes with good support. Drink plenty of water while you exercise to prevent dehydration or heat stroke. Work out until your breathing and your heartbeat get faster (moderate intensity). How often should I exercise? Choose an activity that you enjoy, and set realistic goals. Your health care provider can help you make an activity plan that is individually designed and works best for you. Exercise regularly as told by your health care provider. This may include: Doing strength training two times a week, such as: Lifting weights. Using resistance bands. Push-ups. Sit-ups. Yoga. Doing a certain intensity of exercise for a given amount of time. Choose from these options: A total of 150 minutes of moderate-intensity exercise every week. A total of 75 minutes of vigorous-intensity exercise every week. A mix of moderate-intensity and  vigorous-intensity exercise every week. Children, pregnant women, people who have not exercised regularly, people who are overweight, and older adults may need to talk with a health care provider about what activities are safe to perform. If you have a medical condition, be sure to talk with your health care provider before you start a new exercise program. What are some exercise ideas? Moderate-intensity exercise ideas include: Walking 1 mile (1.6 km) in about 15 minutes. Biking. Hiking. Golfing. Dancing. Water aerobics. Vigorous-intensity exercise ideas include: Walking 4.5 miles (7.2 km) or more in about 1 hour. Jogging or running 5 miles (8 km) in about 1 hour. Biking 10 miles (16.1 km) or more in about 1 hour. Lap swimming. Roller-skating or in-line skating. Cross-country skiing. Vigorous competitive sports, such as football, basketball, and soccer. Jumping rope. Aerobic dancing. What are some everyday activities that can help me get exercise? Yard work, such as: Child psychotherapist. Raking and bagging leaves. Washing your car. Pushing a stroller. Shoveling snow. Gardening. Washing windows or floors. How can I be more active in my day-to-day activities? Use stairs instead of an elevator. Take a walk during your lunch break. If you drive, park your car farther away from your work or school. If you take public transportation, get off one stop early and walk the rest of the way. Stand up or walk around during all of your indoor phone calls. Get up, stretch, and walk around every 30 minutes throughout the day. Enjoy exercise with a friend. Support to continue exercising will help you keep a regular routine of activity. Where to find more information You can find more information about exercising to stay healthy from: U.S. Department of Health and Human Services: ThisPath.fi Centers for Disease Control and Prevention (  CDC): FootballExhibition.com.br Summary Exercising regularly is  important. It will improve your overall fitness, flexibility, and endurance. Regular exercise will also improve your overall health. It can help you control your weight, reduce stress, and improve your bone density. Do not exercise so much that you hurt yourself, feel dizzy, or get very short of breath. Before you start a new exercise program, talk with your health care provider. This information is not intended to replace advice given to you by your health care provider. Make sure you discuss any questions you have with your health care provider. Document Revised: 06/19/2020 Document Reviewed: 06/19/2020 Elsevier Patient Education  2024 [employer].

## 2023-04-01 NOTE — Assessment & Plan Note (Signed)
I will increase her Wegovy to 2.4mg  weekly.  She agrees to rto in 8 weeks for re-evaluation. She is also encouraged to aim for at least 150 minutes of exercise per week.

## 2023-04-04 ENCOUNTER — Encounter: Payer: Self-pay | Admitting: Internal Medicine

## 2023-04-04 ENCOUNTER — Telehealth (INDEPENDENT_AMBULATORY_CARE_PROVIDER_SITE_OTHER): Payer: PRIVATE HEALTH INSURANCE | Admitting: Internal Medicine

## 2023-04-04 DIAGNOSIS — D259 Leiomyoma of uterus, unspecified: Secondary | ICD-10-CM

## 2023-04-04 DIAGNOSIS — R1032 Left lower quadrant pain: Secondary | ICD-10-CM | POA: Insufficient documentation

## 2023-04-04 MED ORDER — BACLOFEN 10 MG PO TABS: 10.0000 mg | ORAL_TABLET | Freq: Every evening | ORAL | 0 refills | Status: DC | PRN

## 2023-04-04 NOTE — Assessment & Plan Note (Addendum)
Due to persistence of her sx, I will refer her for imaging. She agrees w/ CT abdomen/pelvis. Will schedule at [facility] facility per her request. There is a chance her sx are due to muscular strain, will send baclofen to use nightly prn. Will make further recommendations once her CT results are available for review.

## 2023-04-04 NOTE — Assessment & Plan Note (Signed)
Chronic, most recent MR pelvis reviewed in Care Everywhere.

## 2023-04-04 NOTE — Patient Instructions (Signed)
Abdominal Pain, Adult    Many things can cause belly (abdominal) pain. In most cases, belly pain is not a serious problem and can be watched and treated at home. But in some cases, it can be serious.  Your doctor will try to find the cause of your belly pain.  Follow these instructions at home:  Medicines  Take over-the-counter and prescription medicines only as told by your doctor.  Do not take medicines that help you poop (laxatives) unless told by your doctor.  General instructions  Watch your belly pain for any changes. Tell your doctor if the pain gets worse.  Drink enough fluid to keep your pee (urine) pale yellow.  Contact a doctor if:  Your belly pain changes or gets worse.  You have very bad cramping or bloating in your belly.  You vomit.  Your pain gets worse with meals, after eating, or with certain foods.  You have trouble pooping or have watery poop for more than 2-3 days.  You are not hungry, or you lose weight without trying.  You have signs of not getting enough fluid or water (dehydration). These may include:  Dark pee, very little pee, or no pee.  Cracked lips or dry mouth.  Feeling sleepy or weak.  You have pain when you pee or poop.  Your belly pain wakes you up at night.  You have blood in your pee.  You have a fever.  Get help right away if:  You cannot stop vomiting.  Your pain is only in one part of your belly, like on the right side.  You have bloody or black poop, or poop that looks like tar.  You have trouble breathing.  You have chest pain.  These symptoms may be an emergency. Get help right away. Call 911.  Do not wait to see if the symptoms will go away.  Do not drive yourself to the hospital.  This information is not intended to replace advice given to you by your health care provider. Make sure you discuss any questions you have with your health care provider.  Document Revised: 12/08/2021 Document Reviewed: 12/08/2021  Elsevier Patient Education  2024 [employer].

## 2023-04-04 NOTE — Progress Notes (Signed)
Virtual Visit via Video Note  I,Diana Craig, CMA,acting as a Neurosurgeon for Diana Aliment, MD.,have documented all relevant documentation on the behalf of Diana Aliment, MD,as directed by  Diana Aliment, MD while in the presence of Diana Aliment, MD.  I connected with Diana Craig on 04/04/23 at 10:00 AM EST by a video enabled telemedicine application and verified that I am speaking with the correct person using two identifiers.  Patient Location: Home Provider Location: Office/Clinic  I discussed the limitations, risks, security, and privacy concerns of performing an evaluation and management service by video and the availability of in person appointments. I also discussed with the patient that there may be a patient responsible charge related to this service. The patient expressed understanding and agreed to proceed.  Subjective: PCP: Diana Peng, MD  Chief Complaint  Patient presents with   Left Side Pain    Patient presents for virtual appt.  She presents today for further evaluation of abdominal pain.  Her sx started about 2 months ago, but has increased in both frequency and severity.   Described as a dull ache. The pain starts at her left side (not her back as noted in UC notes) and radiates towards her left lower abdomen and left groin. This is not associated with bowel movements. There is no relationship to food intake. This is now a constant pain. Went to UC yesterday, urinalysis was normal and advised to take ibuprofen. She is concerned because she took ibuprofen yesterday without relief. She does have h/o uterine fibroids.          ROS: Per HPI  Current Outpatient Medications:    baclofen (LIORESAL) 10 MG tablet, Take 1 tablet (10 mg total) by mouth at bedtime as needed for muscle spasms., Disp: 30 tablet, Rfl: 0   Cholecalciferol (VITAMIN D3) 50 MCG (2000 UT) TABS, Take by mouth daily., Disp: , Rfl:    docusate sodium (COLACE) 100 MG capsule, Take  100 mg by mouth daily as needed for mild constipation., Disp: , Rfl:    Ferrous Sulfate (IRON PO), Take 16 mLs by mouth daily., Disp: , Rfl:    Multiple Vitamins-Minerals (MULTIVITAMIN ADULT PO), Take by mouth 2 (two) times daily., Disp: , Rfl:    scopolamine (TRANSDERM SCOP, 1.5 MG,) 1 MG/3DAYS, Place 1 patch (1.5 mg total) onto the skin every 3 (three) days., Disp: 4 patch, Rfl: 1   Semaglutide-Weight Management (WEGOVY) 2.4 MG/0.75ML SOAJ, Inject 2.4 mg into the skin once a week., Disp: 3 mL, Rfl: 1   zolpidem (AMBIEN) 5 MG tablet, Take 1 tablet (5 mg total) by mouth at bedtime as needed for sleep., Disp: 30 tablet, Rfl: 2   traMADol (ULTRAM) 50 MG tablet, Take 1 tablet (50 mg total) by mouth every 6 (six) hours as needed for moderate pain or severe pain. (Patient not taking: Reported on 04/04/2023), Disp: 25 tablet, Rfl: 0  Observations/Objective: There were no vitals filed for this visit. Physical Exam Vitals and nursing note reviewed.  Constitutional:      Appearance: Normal appearance.  HENT:     Head: Normocephalic and atraumatic.  Eyes:     Extraocular Movements: Extraocular movements intact.  Pulmonary:     Effort: Pulmonary effort is normal.  Abdominal:     Comments: Pt has tenderness when she palpates LLQ, left flank  Musculoskeletal:     Cervical back: Normal range of motion.  Neurological:     Mental Status: She is  alert.     Assessment and Plan: Left lower quadrant abdominal pain Assessment & Plan: Due to persistence of her sx, I will refer her for imaging. She agrees w/ CT abdomen/pelvis. Will schedule at [facility] facility per her request. There is a chance her sx are due to muscular strain, will send baclofen to use nightly prn. Will make further recommendations once her CT results are available for review.   Orders: -     CT ABDOMEN PELVIS W CONTRAST; Future  Uterine leiomyoma, unspecified location Assessment & Plan: Chronic, most recent MR pelvis reviewed in  Care Everywhere.    Other orders -     Baclofen; Take 1 tablet (10 mg total) by mouth at bedtime as needed for muscle spasms.  Dispense: 30 tablet; Refill: 0   Time in visit: 18 minutes Follow Up Instructions: Return if symptoms worsen or fail to improve.   I discussed the assessment and treatment plan with the patient. The patient was provided an opportunity to ask questions, and all were answered. The patient agreed with the plan and demonstrated an understanding of the instructions.   The patient was advised to call back or seek an in-person evaluation if the symptoms worsen or if the condition fails to improve as anticipated.  The above assessment and management plan was discussed with the patient. The patient verbalized understanding of and has agreed to the management plan.   I, Diana Aliment, MD, have reviewed all documentation for this visit. The documentation on 04/04/23 for the exam, diagnosis, procedures, and orders are all accurate and complete.

## 2023-04-18 ENCOUNTER — Encounter: Payer: Self-pay | Admitting: Internal Medicine

## 2023-04-20 ENCOUNTER — Other Ambulatory Visit: Payer: Self-pay | Admitting: Internal Medicine

## 2023-05-22 ENCOUNTER — Other Ambulatory Visit: Payer: Self-pay | Admitting: Internal Medicine

## 2023-07-20 ENCOUNTER — Encounter: Payer: Self-pay | Admitting: Obstetrics and Gynecology

## 2023-07-20 DIAGNOSIS — R928 Other abnormal and inconclusive findings on diagnostic imaging of breast: Secondary | ICD-10-CM

## 2023-07-21 ENCOUNTER — Encounter: Payer: Self-pay | Admitting: Obstetrics and Gynecology

## 2023-07-21 ENCOUNTER — Other Ambulatory Visit: Payer: Self-pay | Admitting: Internal Medicine

## 2023-07-25 ENCOUNTER — Telehealth: Payer: Self-pay | Admitting: Internal Medicine

## 2023-07-25 ENCOUNTER — Other Ambulatory Visit: Payer: Self-pay | Admitting: Interventional Radiology

## 2023-07-25 DIAGNOSIS — D259 Leiomyoma of uterus, unspecified: Secondary | ICD-10-CM

## 2023-07-25 DIAGNOSIS — N920 Excessive and frequent menstruation with regular cycle: Secondary | ICD-10-CM

## 2023-07-25 NOTE — Telephone Encounter (Signed)
 Copied from CRM [id number]. Topic: Clinical - Medication Question >> Jul 25, 2023  9:30 AM Hassie Lint wrote: Reason for CRM: Verdis Glade from the [facility] called to clarification on the dosage for patients wegovy . Would like a call back to discuss.  Verdis Glade can be reached at [phone]

## 2023-07-27 ENCOUNTER — Other Ambulatory Visit: Payer: Self-pay | Admitting: Obstetrics and Gynecology

## 2023-07-27 DIAGNOSIS — N920 Excessive and frequent menstruation with regular cycle: Secondary | ICD-10-CM

## 2023-07-27 DIAGNOSIS — D259 Leiomyoma of uterus, unspecified: Secondary | ICD-10-CM

## 2023-08-03 ENCOUNTER — Other Ambulatory Visit: Payer: Self-pay | Admitting: Internal Medicine

## 2023-08-04 ENCOUNTER — Encounter: Payer: Self-pay | Admitting: Internal Medicine

## 2023-08-14 ENCOUNTER — Other Ambulatory Visit: Payer: PRIVATE HEALTH INSURANCE

## 2023-08-16 ENCOUNTER — Ambulatory Visit
Admission: RE | Admit: 2023-08-16 | Discharge: 2023-08-16 | Disposition: A | Discharge status: Home / Self Care | Payer: PRIVATE HEALTH INSURANCE | Source: Ambulatory Visit | Attending: Interventional Radiology | Admitting: Interventional Radiology

## 2023-08-16 DIAGNOSIS — N920 Excessive and frequent menstruation with regular cycle: Secondary | ICD-10-CM

## 2023-08-16 DIAGNOSIS — D259 Leiomyoma of uterus, unspecified: Secondary | ICD-10-CM

## 2023-08-16 MED ORDER — GADOPICLENOL 0.5 MMOL/ML IV SOLN
7.0000 mL | Freq: Once | INTRAVENOUS | Status: AC | PRN
  Administered 2023-08-16: 7 mL via INTRAVENOUS

## 2023-08-28 ENCOUNTER — Encounter: Payer: Self-pay | Admitting: Internal Medicine

## 2023-08-28 ENCOUNTER — Ambulatory Visit (INDEPENDENT_AMBULATORY_CARE_PROVIDER_SITE_OTHER): Payer: PRIVATE HEALTH INSURANCE | Admitting: Internal Medicine

## 2023-08-28 VITALS — BP 120/80 | HR 62 | Temp 98.8°F | Ht 64.0 in | Wt 175.0 lb

## 2023-08-28 DIAGNOSIS — F5104 Psychophysiologic insomnia: Secondary | ICD-10-CM

## 2023-08-28 DIAGNOSIS — E66811 Obesity, class 1: Secondary | ICD-10-CM | POA: Diagnosis not present

## 2023-08-28 DIAGNOSIS — Z23 Encounter for immunization: Secondary | ICD-10-CM | POA: Diagnosis not present

## 2023-08-28 DIAGNOSIS — Z6830 Body mass index (BMI) 30.0-30.9, adult: Secondary | ICD-10-CM

## 2023-08-28 DIAGNOSIS — D259 Leiomyoma of uterus, unspecified: Secondary | ICD-10-CM

## 2023-08-28 DIAGNOSIS — E6609 Other obesity due to excess calories: Secondary | ICD-10-CM | POA: Diagnosis not present

## 2023-08-28 DIAGNOSIS — Z0000 Encounter for general adult medical examination without abnormal findings: Secondary | ICD-10-CM | POA: Diagnosis not present

## 2023-08-28 DIAGNOSIS — Z8249 Family history of ischemic heart disease and other diseases of the circulatory system: Secondary | ICD-10-CM

## 2023-08-28 MED ORDER — WEGOVY 2.4 MG/0.75ML ~~LOC~~ SOAJ: 2.4000 mg | SUBCUTANEOUS | 2 refills | Status: DC

## 2023-08-28 MED ORDER — ESZOPICLONE 3 MG PO TABS: 3.0000 mg | ORAL_TABLET | Freq: Every evening | ORAL | 1 refills | Status: DC | PRN

## 2023-08-28 NOTE — Assessment & Plan Note (Addendum)
 Family history of heart disease. Further cardiovascular risk assessment recommended due to age and family history. - Order lipoprotein A blood test. - Discuss possibility of calcium score test. - She is aware of $99 fee

## 2023-08-28 NOTE — Assessment & Plan Note (Signed)
 Recent MRI results reviewed.  Diffuse uterine involvement with numerous small fibroids, largest 3.75 cm. No intracavitary or pedunculated fibroids. MRI confirmed suitability for uterine artery embolization. - Proceed with uterine artery embolization as per GYN and IR. - Increase intake of cruciferous vegetables.

## 2023-08-28 NOTE — Assessment & Plan Note (Addendum)
 On Wegovy  but struggling with weight loss, possibly due to sleep disturbances and perimenopausal symptoms. Engaging in exercise and healthy eating. - Continue Wegovy  with refills sent to [facility] and [place]. - Encourage protein intake through food sources. - Advise adding healthy fats to meals. - Suggest incorporating cottage cheese into eggs. - F/u 12 weeks

## 2023-08-28 NOTE — Assessment & Plan Note (Signed)

## 2023-08-28 NOTE — Progress Notes (Signed)
 I,Diana Craig, CMA,acting as a Neurosurgeon for Diana LOISE Slocumb, MD.,have documented all relevant documentation on the behalf of Diana LOISE Slocumb, MD,as directed by  Diana LOISE Slocumb, MD while in the presence of Diana LOISE Slocumb, MD.  Subjective:    Patient ID: Diana Craig , female    DOB: 1975/08/06 , 48 y.o.   MRN: [id number]  Chief Complaint  Patient presents with   Annual Exam    Patient presents today for annual exam. She has no specific questions or concerns. GYN: Dr Rutherford.     HPI Discussed the use of AI scribe software for clinical note transcription with the patient, who gave verbal consent to proceed.  History of Present Illness [name] Nat is a 48 year old female who presents for a routine physical exam and follow-up on insomnia.  She is undergoing evaluation for uterine fibroids, with an MRI showing diffuse uterine involvement by innumerable small fibroids, the largest being 3.75 cm. A consult is scheduled with a radiologist regarding uterine artery embolization.  She experiences ongoing perimenopausal symptoms, including difficulty sleeping despite using Ambien , waking up at 3 or 4 AM, brain fog, and fatigue. She has been prescribed Zoloft, which she has not yet started, and is taking magnesium glycinate in the evenings.  She is currently on Wegovy  and reports struggling with weight management, attributing it to lack of sleep. She is working out and trying to eat better, including increasing her protein intake with shakes and food.  She has a family history of heart disease on her maternal uncles' side. She has previously undergone a [place] heart test. Her current medications include Ambien , magnesium glycinate, and Wegovy . She is also taking Moringa for hormone balance.  Past Medical History:  Diagnosis Date   Anxiety    GERD (gastroesophageal reflux disease)    Headache(784.0)    menstrual cycle related   IDA (iron  deficiency anemia)     Uterine fibroid      Family History  Problem Relation Age of Onset   COPD Mother    Hypertension Mother    Other Father        health status unknown   Breast cancer Neg Hx      Current Outpatient Medications:    Cholecalciferol (VITAMIN D3) 50 MCG (2000 UT) TABS, Take by mouth daily., Disp: , Rfl:    docusate sodium (COLACE) 100 MG capsule, Take 100 mg by mouth daily as needed for mild constipation., Disp: , Rfl:    eszopiclone 3 MG TABS, Take 1 tablet (3 mg total) by mouth at bedtime as needed. Take immediately before bedtime, Disp: 30 tablet, Rfl: 1   Ferrous Sulfate (IRON  PO), Take 16 mLs by mouth daily., Disp: , Rfl:    Multiple Vitamins-Minerals (MULTIVITAMIN ADULT PO), Take by mouth 2 (two) times daily., Disp: , Rfl:    scopolamine  (TRANSDERM SCOP , 1.5 MG,) 1 MG/3DAYS, Place 1 patch (1.5 mg total) onto the skin every 3 (three) days., Disp: 4 patch, Rfl: 1   Semaglutide -Weight Management (WEGOVY ) 2.4 MG/0.75ML SOAJ, Inject 2.4 mg into the skin once a week., Disp: 3 mL, Rfl: 2   No Known Allergies    The patient states she uses none for birth control. Patient's last menstrual period was 08/14/2023.SABRA Pos for menorrhagia. Negative for: breast discharge, breast lump(s), breast pain and breast self exam. Associated symptoms include abnormal vaginal bleeding. Pertinent negatives include abnormal bleeding (hematology), anxiety, decreased libido, depression, difficulty falling sleep, dyspareunia, history of infertility,  nocturia, sexual dysfunction, sleep disturbances, urinary incontinence, urinary urgency, vaginal discharge and vaginal itching. Diet regular.The patient states her exercise level is  moderate.  . The patient's tobacco use is:  Social History   Tobacco Use  Smoking Status Never  Smokeless Tobacco Never  Tobacco Comments   n/a  . She has been exposed to passive smoke. The patient's alcohol use is:  Social History   Substance and Sexual Activity  Alcohol Use Yes    Comment: social    Review of Systems  Constitutional: Negative.   HENT: Negative.    Eyes: Negative.   Respiratory: Negative.    Cardiovascular: Negative.   Gastrointestinal: Negative.   Endocrine: Negative.   Genitourinary: Negative.   Musculoskeletal: Negative.   Skin: Negative.   Allergic/Immunologic: Negative.   Neurological: Negative.   Hematological: Negative.   Psychiatric/Behavioral:  Positive for sleep disturbance.      Today's Vitals   08/28/23 0901  BP: 120/80  Pulse: 62  Temp: 98.8 F (37.1 C)  SpO2: 98%  Weight: 175 lb (79.4 kg)  Height: 5' 4 (1.626 m)   Body mass index is 30.04 kg/m.  Wt Readings from Last 3 Encounters:  08/28/23 175 lb (79.4 kg)  03/21/23 168 lb (76.2 kg)  12/26/22 165 lb 12.8 oz (75.2 kg)     Objective:  Physical Exam Vitals and nursing note reviewed.  Constitutional:      Appearance: Normal appearance.  HENT:     Head: Normocephalic and atraumatic.     Right Ear: Tympanic membrane, ear canal and external ear normal.     Left Ear: Tympanic membrane, ear canal and external ear normal.     Nose: Nose normal.     Mouth/Throat:     Mouth: Mucous membranes are moist.     Pharynx: Oropharynx is clear.   Eyes:     Extraocular Movements: Extraocular movements intact.     Conjunctiva/sclera: Conjunctivae normal.     Pupils: Pupils are equal, round, and reactive to light.    Cardiovascular:     Rate and Rhythm: Normal rate and regular rhythm.     Pulses: Normal pulses.     Heart sounds: Normal heart sounds.  Pulmonary:     Effort: Pulmonary effort is normal.     Breath sounds: Normal breath sounds.  Chest:  Breasts:    Tanner Score is 5.     Right: Normal.     Left: Normal.  Abdominal:     General: Abdomen is flat. Bowel sounds are normal.     Palpations: Abdomen is soft.  Genitourinary:    Comments: deferred  Musculoskeletal:        General: Normal range of motion.     Cervical back: Normal range of motion and neck  supple.   Skin:    General: Skin is warm and dry.   Neurological:     General: No focal deficit present.     Mental Status: She is alert and oriented to person, place, and time.   Psychiatric:        Mood and Affect: Mood normal.        Behavior: Behavior normal.       Assessment And Plan:     Encounter for general adult medical examination w/o abnormal findings Assessment & Plan: A full exam was performed.  Importance of monthly self breast exams was discussed with the patient.  She is advised to get 30-45 minutes of regular exercise, no less than four  to five days per week. Both weight-bearing and aerobic exercises are recommended.  She is advised to follow a healthy diet with at least six fruits/veggies per day, decrease intake of red meat and other saturated fats and to increase fish intake to twice weekly.  Meats/fish should not be fried -- baked, boiled or broiled is preferable. It is also important to cut back on your sugar intake.  Be sure to read labels - try to avoid anything with added sugar, high fructose corn syrup or other sweeteners.  If you must use a sweetener, you can try stevia or monkfruit.  It is also important to avoid artificially sweetened foods/beverages and diet drinks. Lastly, wear SPF 50 sunscreen on exposed skin and when in direct sunlight for an extended period of time.  Be sure to avoid fast food restaurants and aim for at least 60 ounces of water  daily.      Orders: -     CBC -     CMP14+EGFR -     Lipid panel -     Hemoglobin A1c -     TSH  Chronic insomnia Assessment & Plan: Chronic, unfortunately zolpidem  5mg  nightly has been ineffective. Sx likely exacerbated by perimenopause.  - Start Lunesta 3mg  nightly - She was made aware of bitter taste   Uterine leiomyoma, unspecified location Assessment & Plan: Recent MRI results reviewed.  Diffuse uterine involvement with numerous small fibroids, largest 3.75 cm. No intracavitary or pedunculated  fibroids. MRI confirmed suitability for uterine artery embolization. - Proceed with uterine artery embolization as per GYN and IR. - Increase intake of cruciferous vegetables.   Class 1 obesity due to excess calories without serious comorbidity with body mass index (BMI) of 30.0 to 30.9 in adult Assessment & Plan: On Wegovy  but struggling with weight loss, possibly due to sleep disturbances and perimenopausal symptoms. Engaging in exercise and healthy eating. - Continue Wegovy  with refills sent to [facility] and [place]. - Encourage protein intake through food sources. - Advise adding healthy fats to meals. - Suggest incorporating cottage cheese into eggs. - F/u 12 weeks   Family history of heart disease Assessment & Plan: Family history of heart disease. Further cardiovascular risk assessment recommended due to age and family history. - Order lipoprotein A blood test. - Discuss possibility of calcium score test. - She is aware of $99 fee  Orders: -     Lipoprotein A (LPA)  Immunization due -     Tdap vaccine greater than or equal to 7yo IM  Other orders -     Wegovy ; Inject 2.4 mg into the skin once a week.  Dispense: 3 mL; Refill: 2 -     Eszopiclone; Take 1 tablet (3 mg total) by mouth at bedtime as needed. Take immediately before bedtime  Dispense: 30 tablet; Refill: 1   Return for 1 year HM, 12weeks. Patient was given opportunity to ask questions. Patient verbalized understanding of the plan and was able to repeat key elements of the plan. All questions were answered to their satisfaction.   I, Diana LOISE Slocumb, MD, have reviewed all documentation for this visit. The documentation on 08/28/23 for the exam, diagnosis, procedures, and orders are all accurate and complete.

## 2023-08-28 NOTE — Assessment & Plan Note (Addendum)
 Chronic, unfortunately zolpidem  5mg  nightly has been ineffective. Sx likely exacerbated by perimenopause.  - Start Lunesta 3mg  nightly - She was made aware of bitter taste

## 2023-08-28 NOTE — Patient Instructions (Signed)

## 2023-08-29 LAB — LIPID PANEL
Chol/HDL Ratio: 2.9 ratio (ref 0.0–4.4)
Cholesterol, Total: 186 mg/dL (ref 100–199)
HDL: 65 mg/dL (ref >39)
LDL Chol Calc (NIH): 111 mg/dL — ABNORMAL HIGH (ref 0–99)
Triglycerides: 49 mg/dL (ref 0–149)
VLDL Cholesterol Cal: 10 mg/dL (ref 5–40)

## 2023-08-29 LAB — CBC
Hematocrit: 40.5 % (ref 34.0–46.6)
Hemoglobin: 13.1 g/dL (ref 11.1–15.9)
MCH: 30.5 pg (ref 26.6–33.0)
MCHC: 32.3 g/dL (ref 31.5–35.7)
MCV: 94 fL (ref 79–97)
Platelets: 230 10*3/uL (ref 150–450)
RBC: 4.3 x10E6/uL (ref 3.77–5.28)
RDW: 12.6 % (ref 11.7–15.4)
WBC: 3.2 10*3/uL — ABNORMAL LOW (ref 3.4–10.8)

## 2023-08-29 LAB — CMP14+EGFR
ALT: 18 IU/L (ref 0–32)
AST: 23 IU/L (ref 0–40)
Albumin: 4.1 g/dL (ref 3.9–4.9)
Alkaline Phosphatase: 69 IU/L (ref 44–121)
BUN/Creatinine Ratio: 18 (ref 9–23)
BUN: 12 mg/dL (ref 6–24)
Bilirubin Total: 0.3 mg/dL (ref 0.0–1.2)
CO2: 22 mmol/L (ref 20–29)
Calcium: 9.2 mg/dL (ref 8.7–10.2)
Chloride: 104 mmol/L (ref 96–106)
Creatinine, Ser: 0.65 mg/dL (ref 0.57–1.00)
Globulin, Total: 2.6 g/dL (ref 1.5–4.5)
Glucose: 73 mg/dL (ref 70–99)
Potassium: 4.1 mmol/L (ref 3.5–5.2)
Sodium: 140 mmol/L (ref 134–144)
Total Protein: 6.7 g/dL (ref 6.0–8.5)
eGFR: 109 mL/min/{1.73_m2} (ref >59)

## 2023-08-29 LAB — TSH: TSH: 1.05 u[IU]/mL (ref 0.450–4.500)

## 2023-08-29 LAB — HEMOGLOBIN A1C
Est. average glucose Bld gHb Est-mCnc: 100 mg/dL
Hgb A1c MFr Bld: 5.1 % (ref 4.8–5.6)

## 2023-08-29 LAB — LIPOPROTEIN A (LPA): Lipoprotein (a): 217.9 nmol/L — ABNORMAL HIGH (ref <75.0)

## 2023-08-30 ENCOUNTER — Encounter: Payer: Self-pay | Admitting: Internal Medicine

## 2023-08-30 ENCOUNTER — Ambulatory Visit: Payer: Self-pay | Admitting: Internal Medicine

## 2023-09-04 NOTE — Progress Notes (Signed)
 This encounter was conducted via the [employer] providing interactive audio and visual communication.  The patient provided verbal consent to conduct a virtual appointment.  The patient was located at their primary residence during this encounter.  Chief Complaint: Patient was seen in virtual video consultation today for symptomatic uterine fibroids   Referring Physician(s): Cousins,Sheronette  History of Present Illness: Diana Craig is a 48 y.o. female with a medical history significant for anxiety, iron  deficiency anemia, obesity and uterine fibroids. She had a left breast lumpectomy in 2024 for atypical lobular hyperplasia.   She has a history of heavy, painful cycles related to her uterine fibroids and underwent an exploratory laparotomy with myomectomy x 2 in 2014. In 2023 she was taken back to the OR for a radiofrequency transcervical ablation of uterine fibroids using Sonata , diagnostic hysteroscopy, hysteroscopic resection using myosure, dilation and curettage.   She was evaluated by her PCP earlier this year for left sided abdominal pain. The pain had worsened over a two month period and was described as a dull ache. A CT scan was ordered (performed at [facility]) and was negative for acute findings but did show a bulky fibroid uterus. She was referred to Interventional Radiology for a uterine artery embolization and an MRI Pelvis was ordered for further evaluation.   This imaging study was completed 08/16/23 and the patient presents today via virtual video visit to discuss these results and possible next steps.   Past Medical History:  Diagnosis Date   Anxiety    GERD (gastroesophageal reflux disease)    Headache(784.0)    menstrual cycle related   IDA (iron  deficiency anemia)    Uterine fibroid     Past Surgical History:  Procedure Laterality Date   BREAST BIOPSY Left 06/03/2022   MM LT BREAST BX W LOC DEV 1ST LESION IMAGE BX SPEC STEREO GUIDE 06/03/2022  [facility]   BREAST BIOPSY Left 06/21/2022   US  LT BREAST BX W LOC DEV EA ADD LESION IMG BX SPEC US  GUIDE 06/21/2022 [facility]   BREAST BIOPSY Left 06/21/2022   US  LT BREAST BX W LOC DEV EA ADD LESION IMG BX SPEC US  GUIDE 06/21/2022 [facility]   BREAST BIOPSY Left 06/21/2022   US  LT BREAST BX W LOC DEV 1ST LESION IMG BX SPEC US  GUIDE 06/21/2022 [facility]   BREAST BIOPSY Left 07/05/2022   MM LT BREAST BX W LOC DEV 1ST LESION IMAGE BX SPEC STEREO GUIDE 07/05/2022 [facility]   BREAST BIOPSY  08/30/2022   MM LT RADIOACTIVE SEED LOC MAMMO GUIDE 08/30/2022 [facility]   BREAST BIOPSY Left 06/27/2022   MM LT BREAST BX W LOC DEV 1ST LESION IMAGE BX SPEC STEREO GUIDE 06/27/2022 [facility]   BREAST BIOPSY  08/30/2022   MM LT RADIOACTIVE SEED EA ADD LESION LOC MAMMO GUIDE 08/30/2022 [facility]   BREAST LUMPECTOMY WITH RADIOACTIVE SEED LOCALIZATION Left 08/31/2022   Procedure: LEFT BREAST LUMPECTOMY WITH RADIOACTIVE SEED LOCALIZATION X2;  Surgeon: Vernetta Berg, MD;  Location: [facility];  Service: General;  Laterality: Left;   BUNIONECTOMY Bilateral    right 2008  and left 2010   DILATATION & CURETTAGE/HYSTEROSCOPY WITH MYOSURE N/A 01/07/2022   Procedure: DILATATION & CURETTAGE/HYSTEROSCOPY WITH MYOSURE;  Surgeon: Rutherford Gain, MD;  Location: [facility];  Service: Gynecology;  Laterality: N/A;   DILATION AND CURETTAGE OF UTERUS     x2   last one 2014   KNEE ARTHROSCOPY W/ MENISCAL  REPAIR Left 07/23/2021   [facility]   MYOMECTOMY N/A 02/14/2013   Procedure: Exploratory Laparotomy MYOMECTOMY;  Surgeon: Dickie DELENA Carder, MD;  Location: [facility];  Service: Gynecology;  Laterality: N/A;  2 1/2 hrs.    Allergies: Patient has no known allergies.  Medications: Prior to Admission medications   Medication Sig Start Date End Date Taking? Authorizing Provider  Cholecalciferol (VITAMIN D3) 50 MCG  (2000 UT) TABS Take by mouth daily.    [provider]  docusate sodium (COLACE) 100 MG capsule Take 100 mg by mouth daily as needed for mild constipation.    [provider]  eszopiclone  3 MG TABS Take 1 tablet (3 mg total) by mouth at bedtime as needed. Take immediately before bedtime 08/28/23 08/27/24  Jarold Medici, MD  Ferrous Sulfate (IRON  PO) Take 16 mLs by mouth daily.    [provider]  Multiple Vitamins-Minerals (MULTIVITAMIN ADULT PO) Take by mouth 2 (two) times daily.    [provider]  scopolamine  (TRANSDERM SCOP , 1.5 MG,) 1 MG/3DAYS Place 1 patch (1.5 mg total) onto the skin every 3 (three) days. 09/19/22   Jarold Medici, MD  Semaglutide -Weight Management (WEGOVY ) 2.4 MG/0.75ML SOAJ Inject 2.4 mg into the skin once a week. 08/28/23   Jarold Medici, MD     Family History  Problem Relation Age of Onset   COPD Mother    Hypertension Mother    Other Father        health status unknown   Breast cancer Neg Hx     Social History   Socioeconomic History   Marital status: Married    Spouse name: Not on file   Number of children: Not on file   Years of education: Not on file   Highest education level: Master's degree (e.g., MA, MS, MEng, MEd, MSW, MBA)  Occupational History   Not on file  Tobacco Use   Smoking status: Never   Smokeless tobacco: Never   Tobacco comments:    n/a  Vaping Use   Vaping status: Never Used  Substance and Sexual Activity   Alcohol use: Yes    Comment: social   Drug use: Never   Sexual activity: Not on file  Other Topics Concern   Not on file  Social History Narrative   Not on file   Social Drivers of Health   Financial Resource Strain: Low Risk  (06/24/2022)   Overall Financial Resource Strain (CARDIA)    Difficulty of Paying Living Expenses: Not hard at all  Food Insecurity: No Food Insecurity (06/24/2022)   Hunger Vital Sign    Worried About Running Out of Food in the Last Year: Never true    Ran  Out of Food in the Last Year: Never true  Transportation Needs: No Transportation Needs (06/24/2022)   PRAPARE - Administrator, Civil Service (Medical): No    Lack of Transportation (Non-Medical): No  Physical Activity: Insufficiently Active (06/24/2022)   Exercise Vital Sign    Days of Exercise per Week: 3 days    Minutes of Exercise per Session: 40 min  Stress: Stress Concern Present (06/24/2022)   [employer] of Occupational Health - Occupational Stress Questionnaire    Feeling of Stress : Rather much  Social Connections: Socially Integrated (06/24/2022)   Social Connection and Isolation Panel    Frequency of Communication with Friends and Family: Three times a week    Frequency of Social Gatherings with Friends and Family: Once a week  Attends Religious Services: 1 to 4 times per year    Active Member of Clubs or Organizations: Yes    Attends Banker Meetings: More than 4 times per year    Marital Status: Married     Review of Systems: A 12 point ROS discussed and pertinent positives are indicated in the HPI above.  All other systems are negative.  Review of Systems  Vital Signs: LMP 08/14/2023     Physical Exam  Patient is alert, oriented and able to participate fully in the conversation. No apparent discomfort or distress observed. She appears appropriately dressed.    Imaging: MR Pelvis 08/16/23         Labs:  CBC: Recent Labs    08/28/23 0958  WBC 3.2*  HGB 13.1  HCT 40.5  PLT 230    COAGS: No results for input(s): INR, APTT in the last 8760 hours.  BMP: Recent Labs    08/28/23 0958  NA 140  K 4.1  CL 104  CO2 22  GLUCOSE 73  BUN 12  CALCIUM 9.2  CREATININE 0.65    LIVER FUNCTION TESTS: Recent Labs    08/28/23 0958  BILITOT 0.3  AST 23  ALT 18  ALKPHOS 69  PROT 6.7  ALBUMIN 4.1    TUMOR MARKERS: No results for input(s): AFPTM, CEA, CA199, CHROMGRNA in the last 8760  hours.  Assessment and Plan:  49 year old female with a history of symptomatic uterine fibroids with menorrhagia.   Thank you for this interesting consult.  I greatly enjoyed meeting Diana Craig and look forward to participating in their care.  A copy of this report was sent to the requesting provider on this date.  Ester Sides, MD Pager: [phone]    I spent a total of  40 Minutes   in virtual video clinical consultation, greater than 50% of which was counseling/coordinating care for symptomatic uterine fibroids.

## 2023-09-06 ENCOUNTER — Inpatient Hospital Stay
Admission: RE | Admit: 2023-09-06 | Discharge: 2023-09-06 | Disposition: A | Discharge status: Home / Self Care | Payer: PRIVATE HEALTH INSURANCE | Source: Ambulatory Visit | Attending: Obstetrics and Gynecology | Admitting: Obstetrics and Gynecology

## 2023-09-06 DIAGNOSIS — D259 Leiomyoma of uterus, unspecified: Secondary | ICD-10-CM

## 2023-09-06 DIAGNOSIS — N920 Excessive and frequent menstruation with regular cycle: Secondary | ICD-10-CM

## 2023-09-06 HISTORY — PX: IR RADIOLOGIST EVAL & MGMT: IMG5224

## 2023-09-11 ENCOUNTER — Other Ambulatory Visit: Payer: Self-pay | Admitting: Obstetrics and Gynecology

## 2023-09-11 ENCOUNTER — Encounter: Payer: Self-pay | Admitting: Obstetrics and Gynecology

## 2023-09-11 DIAGNOSIS — N6320 Unspecified lump in the left breast, unspecified quadrant: Secondary | ICD-10-CM

## 2023-09-20 ENCOUNTER — Ambulatory Visit
Admission: RE | Admit: 2023-09-20 | Discharge: 2023-09-20 | Disposition: A | Discharge status: Home / Self Care | Payer: PRIVATE HEALTH INSURANCE | Source: Ambulatory Visit | Attending: Obstetrics and Gynecology | Admitting: Obstetrics and Gynecology

## 2023-09-20 ENCOUNTER — Ambulatory Visit: Admission: RE | Admit: 2023-09-20 | Payer: PRIVATE HEALTH INSURANCE | Source: Ambulatory Visit

## 2023-09-20 DIAGNOSIS — N6320 Unspecified lump in the left breast, unspecified quadrant: Secondary | ICD-10-CM

## 2023-10-18 ENCOUNTER — Other Ambulatory Visit: Payer: Self-pay | Admitting: Interventional Radiology

## 2023-10-18 DIAGNOSIS — D259 Leiomyoma of uterus, unspecified: Secondary | ICD-10-CM

## 2023-10-18 DIAGNOSIS — N939 Abnormal uterine and vaginal bleeding, unspecified: Secondary | ICD-10-CM

## 2023-10-23 ENCOUNTER — Encounter: Payer: Self-pay | Admitting: Internal Medicine

## 2023-10-25 ENCOUNTER — Ambulatory Visit: Payer: Self-pay | Admitting: Internal Medicine

## 2023-10-25 MED ORDER — FENTANYL CITRATE PF 50 MCG/ML IJ SOSY: 25.0000 ug | PREFILLED_SYRINGE | INTRAMUSCULAR | Status: DC | PRN

## 2023-10-25 MED ORDER — MIDAZOLAM HCL 2 MG/2ML IJ SOLN: 1.0000 mg | INTRAMUSCULAR | Status: DC | PRN

## 2023-10-26 ENCOUNTER — Telehealth: Payer: Self-pay

## 2023-10-26 DIAGNOSIS — D259 Leiomyoma of uterus, unspecified: Secondary | ICD-10-CM

## 2023-10-26 MED ORDER — KETOROLAC TROMETHAMINE 10 MG PO TABS: 10.0000 mg | ORAL_TABLET | Freq: Four times a day (QID) | ORAL | Status: AC

## 2023-10-26 MED ORDER — DOCUSATE SODIUM 100 MG PO CAPS: 100.0000 mg | ORAL_CAPSULE | Freq: Two times a day (BID) | ORAL | 0 refills | Status: AC

## 2023-10-26 MED ORDER — PROMETHAZINE HCL 12.5 MG PO TABS: 12.5000 mg | ORAL_TABLET | ORAL | 0 refills | Status: DC | PRN

## 2023-10-26 MED ORDER — PROMETHAZINE HCL 12.5 MG PO TABS: 12.5000 mg | ORAL_TABLET | ORAL | 0 refills | Status: AC | PRN

## 2023-10-26 MED ORDER — IBUPROFEN 200 MG PO TABS: 800.0000 mg | ORAL_TABLET | Freq: Three times a day (TID) | ORAL | 0 refills | Status: AC

## 2023-10-26 NOTE — Progress Notes (Signed)
 Called [facility] on [address], spoke with Glade, confirmed they received RX's for Phenergan , Colace, and Ibuprofen , Glade states she will delete the RX's that came through more than once, concerning the above mentioned meds

## 2023-10-26 NOTE — Discharge Instructions (Signed)
 Uterine Artery Embolization After Care    After the procedure, it is common to have mild pain or discomfort at the arterial entry site. It is also common to experience uterine cramping. These cramps can vary in strength from what you would consider to be a bad menstrual cycle all the way up to as severe as labor pains. The cramping is typically the most severe the afternoon and evening the day of the procedure and begin to improve the next day and each day thereafter. You may have vaginal bleeding. Your cycle may become irregular the first several months. You may have vaginal discharge. We recommend you wear a panty liner for first 4-6 weeks following your procedure.  You could also experience nausea and vomiting.  Take your medicines exactly as told, at the same time every day. This is vital to helping you with a smooth recovery.   Toradol  10mg  is a non-steroidal anti-inflammatory medicine that is critical in keeping your inflammation and pain under control.  You must take this every 6 hours for 3 days.  Motrin /Advill 800mg  is also a non-steroidal anti-inflammatory medicine that is critical in keeping your inflammation and pain under control.  You must take this every 8 hours for 4 days AFTER Toradol  has been completed.  Percocet (oxycodone /acetaminophen ) is a combination narcotic pain medication with Tylenol  to help with your pain.  Take it every 4 hours regardless of your pain level the first 2 days.  Set an alarm to wake up so you don't miss a dose overnight and get behind on your pain control.  After the first 48 hrs, if your pain is minimal you can take this only as needed.    Colace (docusate sodium) 100mg  is a stool softener to help prevent constipation.  The pain medications often cause constipation which can be particularly uncomfortable after COLOMBIA.  We recommend you take this at twice a day as prescribed while you are using Oxycodone /Percocet.   Phenergan (promethazine) 12.5mg  is a  medication for nausea and/or vomiting.  Take this every 4 hours as needed.   You may resume your regular prescription medications, as well.  Leave your bandage on for 24 hrs and keep the area dry. You may remove the bandage after 24 hrs and then shower as normal. Do not submerge in a bath, pool or hot tub until the small incision is completely healed (5-7 days). Do not lift anything that is heavier than 5 lb (2.3 kg) for the first 3 days. Return to your normal activities after day 5, though progress slowly and listen to your body.   Many women find a hot water bottle or heating pad helpful when placed on the lower abdomen.  This is fine to do if it helps your cramps.  Do not use any products that contain nicotine or tobacco. These products include cigarettes, chewing tobacco, and vaping devices, such as e-cigarettes. These can delay incision healing. If you need help quitting, ask your health care provider.  You may resume sexual activity when you feel up to it. Do not drink alcohol until you no longer are taking narcotic pain medicine.    Please contact our office at [phone] for any signs of an infection, such as a fever greater than 100 degrees, redness/swelling/pain around the incision site, fluid or blood oozing from the incision site, if the incision feels warm to the touch, if you have pus or a bad smell coming from your incision or vagina, if you have a rash,  if you have nausea or cannot eat anything without vomiting or if you have continued vaginal discharge that is not getting lighter.  If you need to speak to someone after business hours or over the weekend, please call the on-call Interventional Radiologist service at [phone]. Tell them you are a patient of Dr. Terrill and you had a Uterine Artery Embolization, along with any issues you are having.    Thanks for visiting [facility]!

## 2023-10-26 NOTE — Telephone Encounter (Signed)
 E-scribed pt post procedure medications post COLOMBIA, unable to e-scribe toradol .  This RN [facility], she stated you (I) may not be able to e-scribe it until and IV/IM dose has been given, will follow-up with this on Monday 10/30/2023, day of the procedure  (Toradol  comes up as a clinic medication)

## 2023-10-30 ENCOUNTER — Ambulatory Visit
Admission: RE | Admit: 2023-10-30 | Discharge: 2023-10-30 | Disposition: A | Discharge status: Home / Self Care | Payer: PRIVATE HEALTH INSURANCE | Source: Ambulatory Visit | Attending: Interventional Radiology | Admitting: Interventional Radiology

## 2023-10-30 ENCOUNTER — Telehealth: Payer: Self-pay

## 2023-10-30 ENCOUNTER — Other Ambulatory Visit (HOSPITAL_COMMUNITY): Payer: Self-pay | Admitting: Radiology

## 2023-10-30 DIAGNOSIS — N939 Abnormal uterine and vaginal bleeding, unspecified: Secondary | ICD-10-CM

## 2023-10-30 DIAGNOSIS — D259 Leiomyoma of uterus, unspecified: Secondary | ICD-10-CM

## 2023-10-30 HISTORY — PX: IR EMBO TUMOR ORGAN ISCHEMIA INFARCT INC GUIDE ROADMAPPING: IMG5449

## 2023-10-30 MED ORDER — PANTOPRAZOLE SODIUM 40 MG IV SOLR
40.0000 mg | Freq: Once | INTRAVENOUS | Status: AC
  Administered 2023-10-30: 40 mg via INTRAVENOUS

## 2023-10-30 MED ORDER — VERAPAMIL HCL 2.5 MG/ML IV SOLN: Freq: Once | INTRA_ARTERIAL | Status: AC

## 2023-10-30 MED ORDER — LIDOCAINE-EPINEPHRINE 1 %-1:100000 IJ SOLN
10.0000 mL | Freq: Once | INTRAMUSCULAR | Status: AC
  Administered 2023-10-30: 10 mL via INTRADERMAL

## 2023-10-30 MED ORDER — DEXAMETHASONE SODIUM PHOSPHATE 10 MG/ML IJ SOLN
10.0000 mg | Freq: Once | INTRAMUSCULAR | Status: AC
  Administered 2023-10-30: 10 mg via INTRAVENOUS

## 2023-10-30 MED ORDER — HYDROCODONE-ACETAMINOPHEN 5-325 MG PO TABS: 1.0000 | ORAL_TABLET | ORAL | 0 refills | Status: DC | PRN

## 2023-10-30 MED ORDER — ACETAMINOPHEN 10 MG/ML IV SOLN
1000.0000 mg | Freq: Once | INTRAVENOUS | Status: AC
  Administered 2023-10-30: 1000 mg via INTRAVENOUS

## 2023-10-30 MED ORDER — IOPAMIDOL (ISOVUE-300) INJECTION 61%
100.0000 mL | Freq: Once | INTRAVENOUS | Status: AC | PRN
  Administered 2023-10-30: 75 mL via INTRA_ARTERIAL

## 2023-10-30 MED ORDER — FENTANYL CITRATE PF 50 MCG/ML IJ SOSY
25.0000 ug | PREFILLED_SYRINGE | INTRAMUSCULAR | Status: DC | PRN
  Administered 2023-10-30: 50 ug via INTRAVENOUS

## 2023-10-30 MED ORDER — LIDOCAINE HCL (PF) 1 % IJ SOLN
5.0000 mL | Freq: Once | INTRAMUSCULAR | Status: AC
  Administered 2023-10-30: 5 mL

## 2023-10-30 MED ORDER — OXYCODONE HCL ER 10 MG PO T12A
10.0000 mg | EXTENDED_RELEASE_TABLET | Freq: Once | ORAL | Status: AC
  Administered 2023-10-30: 10 mg via ORAL

## 2023-10-30 MED ORDER — KETOROLAC TROMETHAMINE 30 MG/ML IJ SOLN
30.0000 mg | Freq: Once | INTRAMUSCULAR | Status: AC
  Administered 2023-10-30: 30 mg via INTRAMUSCULAR

## 2023-10-30 MED ORDER — NITROGLYCERIN 2 % TD OINT
0.5000 [in_us] | TOPICAL_OINTMENT | Freq: Once | TRANSDERMAL | Status: AC
  Administered 2023-10-30: 0.5 [in_us] via TOPICAL

## 2023-10-30 MED ORDER — SODIUM CHLORIDE 0.9 % IV SOLN: INTRAVENOUS | Status: DC

## 2023-10-30 MED ORDER — HYDROCODONE-ACETAMINOPHEN 5-325 MG PO TABS
1.0000 | ORAL_TABLET | ORAL | 0 refills | Status: DC | PRN
Start: 2023-10-30 — End: 2023-12-05

## 2023-10-30 MED ORDER — CEFAZOLIN SODIUM-DEXTROSE 2-4 GM/100ML-% IV SOLN
2.0000 g | INTRAVENOUS | Status: AC
  Administered 2023-10-30: 2 g via INTRAVENOUS

## 2023-10-30 MED ORDER — ONDANSETRON HCL 4 MG/2ML IJ SOLN
8.0000 mg | Freq: Once | INTRAMUSCULAR | Status: AC
  Administered 2023-10-30: 8 mg via INTRAVENOUS

## 2023-10-30 MED ORDER — HYDROMORPHONE HCL 1 MG/ML IJ SOLN
1.0000 mg | Freq: Once | INTRAMUSCULAR | Status: AC
  Administered 2023-10-30: 1 mg via INTRAVENOUS

## 2023-10-30 MED ORDER — MIDAZOLAM HCL 2 MG/2ML IJ SOLN
1.0000 mg | INTRAMUSCULAR | Status: DC | PRN
  Administered 2023-10-30: 1 mg via INTRAVENOUS

## 2023-10-30 MED ORDER — PROMETHAZINE HCL 25 MG PO TABS: 25.0000 mg | ORAL_TABLET | ORAL | Status: DC | PRN

## 2023-10-30 MED ORDER — KETOROLAC TROMETHAMINE 10 MG PO TABS: 10.0000 mg | ORAL_TABLET | Freq: Four times a day (QID) | ORAL | 0 refills | Status: AC

## 2023-10-30 MED ORDER — LIDOCAINE 5 % EX OINT: TOPICAL_OINTMENT | Freq: Once | CUTANEOUS | Status: AC

## 2023-10-30 MED ORDER — KETOROLAC TROMETHAMINE 30 MG/ML IJ SOLN
30.0000 mg | Freq: Once | INTRAMUSCULAR | Status: AC
  Administered 2023-10-30: 30 mg via INTRAVENOUS

## 2023-10-30 NOTE — H&P (Signed)
 Chief Complaint: Patient was seen in consultation today for symptomatic uterine fibroids--- for uterine artery embolization at the request of Dr GORMAN Carder  Supervising Physician: Jennefer Rover  Patient Status: [facility] D[facility]  History of Present Illness: Diana Craig is a 48 y.o. female   FULL Code status per pt    Pt was seen in Consult with Dr Jennefer 09/06/23 for symptomatic uterine fibroids  Symptomatic uterine fibroids.  Her primary symptoms are abnormal uterine bleeding and bulk, and she has previously tried myomectomy and ablation.  The patient was counseled on options for treatment of uterine fibroids with their accompanying symptoms of heavy menses, painful periods, and/or bulk symptoms including doing nothing, hormonal therapy, myomectomy, hysterectomy, and uterine artery embolization.    We discussed uterine artery embolization (COLOMBIA) as a potential therapy.  We described the procedure itself, including the possibility of using analgesia for pain control, a bladder catheter, and admission to the hospital for a 23 hour inpatient observation period.  The procedure is done under conscious sedation.  We quoted an efficacy of 90% for improvement of menorrhagia and of 75% for improvement of bulk symptoms at one year related to fibroids.  We informed the patient that approximately 80% of those patients described sustained benefits from the procedure at 5 years.  We discussed secondary hysterectomy rates for persistent symptoms of 3%, 4%, 10%, and 17% at 1, 2, 3, and 5 years, respectively.  IF the patient also had adenomyosis as a potential contributing cause for her symptoms, we quoted a higher rate of recurrent symptoms after COLOMBIA, being 30-50% at 3-5 years.      We also quoted a 1 in 250-500 (0.2-0.4%) incidence of requiring emergent or semi-emergent hysterectomy due a procedural complication, as well an as approximately 7% chance of early onset menopause, mostly in women >27 years of age, with  a less than 1% risk for women less than 75 years of age.  We discussed the need to evaluate the ovarian arteries and in some cases the need to treat the fibroids via the ovarian arteries which can lead to a higher risk of early menopause.  We discussed that there have been numerous successful full-term pregnancies after COLOMBIA, with a reported pregnancy rate of 40% in women desiring pregnancy, with a slightly higher risk for abnormal placentation after COLOMBIA.  However, we also explained that abnormal placentation can also be seen after a D&C procedure, C-section, and hysteroscopic myomectomy.  We also discussed the risk of an occult malignancy in patients with symptomatic fibroids being anywhere from 1 in 350 to 1 in 8,000 (0.0125-0.3%), depending upon the patient's age, symptom complex, family risk factors, and appearance of the fibroid on her MRI study. We also briefly discussed surgical options of treatment offered by our Gynecology colleagues with hysterectomy, myomectomy and/or endometrial ablation.     After her visit today she is most interested in uterine artery embolization. Plan: Plan to proceed with uterine artery embolization at [facility].   Sedation plan:  moderate Planned access site:  left radial Contrast premedication: none Labs prior to procedure: none Post-procedure observation plan: discharge home Medications to be held prior to procedure: none Immediately prior to procedure: Ancef  2 g IV, decadron  10 mg IV, Zofran  8 mg IV, Oxycodone  10 mg ER PO, Tylenol  1 g PO, insert Foley cath   Pt here today for this procedure with Dr Jennefer She has no complaints today Denies N/V/D Denies fever or pain She is NPO  Past  Medical History:  Diagnosis Date   Anxiety    GERD (gastroesophageal reflux disease)    Headache(784.0)    menstrual cycle related   IDA (iron  deficiency anemia)    Uterine fibroid     Past Surgical History:  Procedure Laterality Date   BREAST BIOPSY Left  06/03/2022   MM LT BREAST BX W LOC DEV 1ST LESION IMAGE BX SPEC STEREO GUIDE 06/03/2022 [facility]   BREAST BIOPSY Left 06/21/2022   US  LT BREAST BX W LOC DEV EA ADD LESION IMG BX SPEC US  GUIDE 06/21/2022 [facility]   BREAST BIOPSY Left 06/21/2022   US  LT BREAST BX W LOC DEV EA ADD LESION IMG BX SPEC US  GUIDE 06/21/2022 [facility]   BREAST BIOPSY Left 06/21/2022   US  LT BREAST BX W LOC DEV 1ST LESION IMG BX SPEC US  GUIDE 06/21/2022 [facility]   BREAST BIOPSY Left 07/05/2022   MM LT BREAST BX W LOC DEV 1ST LESION IMAGE BX SPEC STEREO GUIDE 07/05/2022 [facility]   BREAST BIOPSY  08/30/2022   MM LT RADIOACTIVE SEED LOC MAMMO GUIDE 08/30/2022 [facility]   BREAST BIOPSY Left 06/27/2022   MM LT BREAST BX W LOC DEV 1ST LESION IMAGE BX SPEC STEREO GUIDE 06/27/2022 [facility]   BREAST BIOPSY  08/30/2022   MM LT RADIOACTIVE SEED EA ADD LESION LOC MAMMO GUIDE 08/30/2022 [facility]   BREAST LUMPECTOMY WITH RADIOACTIVE SEED LOCALIZATION Left 08/31/2022   Procedure: LEFT BREAST LUMPECTOMY WITH RADIOACTIVE SEED LOCALIZATION X2;  Surgeon: Vernetta Berg, MD;  Location: [facility];  Service: General;  Laterality: Left;   BUNIONECTOMY Bilateral    right 2008  and left 2010   DILATATION & CURETTAGE/HYSTEROSCOPY WITH MYOSURE N/A 01/07/2022   Procedure: DILATATION & CURETTAGE/HYSTEROSCOPY WITH MYOSURE;  Surgeon: Rutherford Gain, MD;  Location: [facility];  Service: Gynecology;  Laterality: N/A;   DILATION AND CURETTAGE OF UTERUS     x2   last one 2014   IR RADIOLOGIST EVAL & MGMT  09/06/2023   KNEE ARTHROSCOPY W/ MENISCAL REPAIR Left 07/23/2021   [facility]   MYOMECTOMY N/A 02/14/2013   Procedure: Exploratory Laparotomy MYOMECTOMY;  Surgeon: Gain DELENA Rutherford, MD;  Location: [facility];  Service: Gynecology;  Laterality: N/A;  2 1/2 hrs.    Allergies: Patient has no known allergies.  Medications: Prior to  Admission medications   Medication Sig Start Date End Date Taking? Authorizing Provider  Cholecalciferol (VITAMIN D3) 50 MCG (2000 UT) TABS Take by mouth daily.    [provider]  docusate sodium  (COLACE) 100 MG capsule Take 100 mg by mouth daily as needed for mild constipation.    [provider]  docusate sodium  (COLACE) 100 MG capsule Take 1 capsule (100 mg total) by mouth 2 (two) times daily for 7 days. 10/30/23 11/06/23  Suttle, Ester PARAS, MD  docusate sodium  (COLACE) 100 MG capsule Take 1 capsule (100 mg total) by mouth 2 (two) times daily for 7 days. 10/26/23 11/02/23  Suttle, Ester PARAS, MD  eszopiclone  3 MG TABS Take 1 tablet (3 mg total) by mouth at bedtime as needed. Take immediately before bedtime 08/28/23 08/27/24  Jarold Medici, MD  Ferrous Sulfate (IRON  PO) Take 16 mLs by mouth daily.    [provider]  ibuprofen  (MOTRIN  IB) 200 MG tablet Take 4 tablets (800 mg total) by mouth every 8 (eight) hours for 4 days. 11/02/23 11/06/23  Suttle, Ester PARAS, MD  ibuprofen  (MOTRIN  IB)  200 MG tablet Take 4 tablets (800 mg total) by mouth every 8 (eight) hours for 4 days. 10/30/23 11/03/23  SuttleEster PARAS, MD  Multiple Vitamins-Minerals (MULTIVITAMIN ADULT PO) Take by mouth 2 (two) times daily.    [provider]  promethazine  (PHENERGAN ) 12.5 MG tablet Take 1 tablet (12.5 mg total) by mouth every 4 (four) hours as needed for nausea or vomiting. 10/26/23   Suttle, Ester PARAS, MD  promethazine  (PHENERGAN ) 12.5 MG tablet Take 1 tablet (12.5 mg total) by mouth every 4 (four) hours as needed for nausea or vomiting. 10/26/23   Suttle, Ester PARAS, MD  scopolamine  (TRANSDERM SCOP , 1.5 MG,) 1 MG/3DAYS Place 1 patch (1.5 mg total) onto the skin every 3 (three) days. 09/19/22   Jarold Medici, MD  Semaglutide -Weight Management (WEGOVY ) 2.4 MG/0.75ML SOAJ Inject 2.4 mg into the skin once a week. 08/28/23   Jarold Medici, MD     Family History  Problem Relation Age of Onset   COPD Mother     Hypertension Mother    Other Father        health status unknown   Breast cancer Neg Hx     Social History   Socioeconomic History   Marital status: Married    Spouse name: Not on file   Number of children: Not on file   Years of education: Not on file   Highest education level: Master's degree (e.g., MA, MS, MEng, MEd, MSW, MBA)  Occupational History   Not on file  Tobacco Use   Smoking status: Never   Smokeless tobacco: Never   Tobacco comments:    n/a  Vaping Use   Vaping status: Never Used  Substance and Sexual Activity   Alcohol use: Yes    Comment: social   Drug use: Never   Sexual activity: Not on file  Other Topics Concern   Not on file  Social History Narrative   Not on file   Social Drivers of Health   Financial Resource Strain: Low Risk  (06/24/2022)   Overall Financial Resource Strain (CARDIA)    Difficulty of Paying Living Expenses: Not hard at all  Food Insecurity: No Food Insecurity (06/24/2022)   Hunger Vital Sign    Worried About Running Out of Food in the Last Year: Never true    Ran Out of Food in the Last Year: Never true  Transportation Needs: No Transportation Needs (06/24/2022)   PRAPARE - Administrator, Civil Service (Medical): No    Lack of Transportation (Non-Medical): No  Physical Activity: Insufficiently Active (06/24/2022)   Exercise Vital Sign    Days of Exercise per Week: 3 days    Minutes of Exercise per Session: 40 min  Stress: Stress Concern Present (06/24/2022)   [employer] of Occupational Health - Occupational Stress Questionnaire    Feeling of Stress : Rather much  Social Connections: Socially Integrated (06/24/2022)   Social Connection and Isolation Panel    Frequency of Communication with Friends and Family: Three times a week    Frequency of Social Gatherings with Friends and Family: Once a week    Attends Religious Services: 1 to 4 times per year    Active Member of [employer] or Organizations: Yes    Attends  Engineer, structural: More than 4 times per year    Marital Status: Married    Review of Systems: A 12 point ROS discussed and pertinent positives are indicated in the HPI above.  All other  systems are negative.  Review of Systems  Constitutional:  Negative for activity change, fatigue and fever.  Respiratory:  Negative for cough and shortness of breath.   Cardiovascular:  Negative for chest pain and leg swelling.  Gastrointestinal:  Negative for abdominal pain, diarrhea, nausea and vomiting.  Genitourinary:  Positive for pelvic pain.  Musculoskeletal:  Negative for back pain.  Neurological:  Negative for weakness.  Psychiatric/Behavioral:  Negative for behavioral problems and confusion.     Vital Signs: BP 121/71 (BP Location: Left Arm, Patient Position: Sitting, Cuff Size: Normal)   Pulse 66   Temp 97.8 F (36.6 C) (Oral)   Resp 16   SpO2 98%     Physical Exam Vitals reviewed.  HENT:     Mouth/Throat:     Mouth: Mucous membranes are moist.  Cardiovascular:     Rate and Rhythm: Normal rate and regular rhythm.     Heart sounds: Normal heart sounds. No murmur heard. Pulmonary:     Effort: Pulmonary effort is normal.     Breath sounds: Normal breath sounds. No wheezing.  Abdominal:     Palpations: Abdomen is soft.     Tenderness: There is no abdominal tenderness.  Musculoskeletal:        General: Normal range of motion.     Right lower leg: No edema.     Left lower leg: No edema.  Skin:    General: Skin is warm.  Neurological:     Mental Status: She is alert and oriented to person, place, and time.  Psychiatric:        Behavior: Behavior normal.     Imaging: No results found.  Labs:  CBC: Recent Labs    08/28/23 0958  WBC 3.2*  HGB 13.1  HCT 40.5  PLT 230    COAGS: No results for input(s): INR, APTT in the last 8760 hours.  BMP: Recent Labs    08/28/23 0958  NA 140  K 4.1  CL 104  CO2 22  GLUCOSE 73  BUN 12  CALCIUM 9.2   CREATININE 0.65    LIVER FUNCTION TESTS: Recent Labs    08/28/23 0958  BILITOT 0.3  AST 23  ALT 18  ALKPHOS 69  PROT 6.7  ALBUMIN 4.1    TUMOR MARKERS: No results for input(s): AFPTM, CEA, CA199, CHROMGRNA in the last 8760 hours.  Assessment and Plan:  Scheduled today for Uterine artery embolization for symptomatic uterine fibroid The Risks and benefits of embolization were discussed with the patient including, but not limited to bleeding, infection, vascular injury, post operative pain, or contrast induced renal failure.  This procedure involves the use of X-rays and because of the nature of the planned procedure, it is possible that we will have prolonged use of X-ray fluoroscopy.  Potential radiation risks to you include (but are not limited to) the following: - A slightly elevated risk for cancer several years later in life. This risk is typically less than 0.5% percent. This risk is low in comparison to the normal incidence of human cancer, which is 33% for women and 50% for men according to the American Cancer Society. - Radiation induced injury can include skin redness, resembling a rash, tissue breakdown / ulcers and hair loss (which can be temporary or permanent).   The likelihood of either of these occurring depends on the difficulty of the procedure and whether you are sensitive to radiation due to previous procedures, disease, or genetic conditions.   IF your  procedure requires a prolonged use of radiation, you will be notified and given written instructions for further action.  It is your responsibility to monitor the irradiated area for the 2 weeks following the procedure and to notify your physician if you are concerned that you have suffered a radiation induced injury.    All of the patient's questions were answered, patient is agreeable to proceed. Consent signed and in chart.  Thank you for this interesting consult.  I greatly enjoyed meeting Neftali  N Frances and look forward to participating in their care.  A copy of this report was sent to the requesting provider on this date.  Electronically Signed: Sharlet DELENA Candle, PA-C 10/30/2023, 8:29 AM   I spent a total of    25 Minutes in face to face in clinical consultation, greater than 50% of which was counseling/coordinating care for Uterine artery embolization

## 2023-10-30 NOTE — Procedures (Signed)
 Interventional Radiology Procedure Note  Procedure: Uterine artery embolization   Findings: Please refer to procedural dictation for full description. Left radial artery access, TR band applied.  Complications: None immediate  Estimated Blood Loss: < 5 ml  Recommendations: TR band release protocol. IR will arrange 1 month clinic follow up.   Ester Sides, MD

## 2023-10-31 ENCOUNTER — Telehealth (HOSPITAL_COMMUNITY): Payer: Self-pay | Admitting: Student

## 2023-10-31 MED ORDER — OXYCODONE-ACETAMINOPHEN 5-325 MG PO TABS: 1.0000 | ORAL_TABLET | Freq: Four times a day (QID) | ORAL | 0 refills | Status: AC | PRN

## 2023-10-31 NOTE — Telephone Encounter (Signed)
 Patient s/p COLOMBIA with Dr. Jennefer 10/30/23. Patient was discharged with Norco for pain relief post-procedure. Patient states this medication causes headaches. A new prescription for percocet was e-prescribed to the [facility]. 1 tablet Q6 hours PRN severe pain x 2 days for a total of 8 tablets dispensed.   Warren Dais, AGACNP-BC 10/31/2023, 2:39 PM

## 2023-11-01 ENCOUNTER — Telehealth: Payer: Self-pay

## 2023-11-13 ENCOUNTER — Other Ambulatory Visit: Payer: Self-pay | Admitting: Internal Medicine

## 2023-11-14 ENCOUNTER — Ambulatory Visit: Payer: Self-pay | Admitting: Internal Medicine

## 2023-11-23 ENCOUNTER — Ambulatory Visit: Payer: PRIVATE HEALTH INSURANCE | Admitting: Internal Medicine

## 2023-12-05 ENCOUNTER — Ambulatory Visit: Payer: PRIVATE HEALTH INSURANCE | Admitting: Internal Medicine

## 2023-12-05 DIAGNOSIS — Z6830 Body mass index (BMI) 30.0-30.9, adult: Secondary | ICD-10-CM

## 2023-12-05 DIAGNOSIS — N951 Menopausal and female climacteric states: Secondary | ICD-10-CM

## 2023-12-05 DIAGNOSIS — E66811 Obesity, class 1: Secondary | ICD-10-CM

## 2023-12-05 MED ORDER — ZEPBOUND 7.5 MG/0.5ML ~~LOC~~ SOAJ: 7.5000 mg | SUBCUTANEOUS | 0 refills | Status: DC

## 2023-12-05 NOTE — Progress Notes (Unsigned)
 Virtual Visit via Video Note  I,[name], CMA,acting as a Neurosurgeon for Diana LOISE Slocumb, MD.,have documented all relevant documentation on the behalf of Diana LOISE Slocumb, MD,as directed by  Diana LOISE Slocumb, MD while in the presence of Diana LOISE Slocumb, MD.  I connected with Diana Craig on 12/05/23 at 12:00 PM EDT by a video enabled telemedicine application and verified that I am speaking with the correct person using two identifiers.  Patient Location: Home Provider Location: Office/Clinic  I discussed the limitations, risks, security, and privacy concerns of performing an evaluation and management service by video and the availability of in person appointments. I also discussed with the patient that there may be a patient responsible charge related to this service. The patient expressed understanding and agreed to proceed.  Subjective: PCP: Craig Catheryn, MD  Chief Complaint  Patient presents with   Weight Check   She presents today for virtual visit. She presents for f/u weight check. She has been taking Wegovy  without any issues; however, she feels she has hit a plateau. She is no longer losing at the same rate. States she is still exercising regularly.    Also,she is now followed by new GYN at [facility]. She went for further evaluation of her perimenopausal sx. Now on new meds, not yet started.   ROS: Per HPI  Current Outpatient Medications:    tirzepatide (ZEPBOUND) 7.5 MG/0.5ML Pen, Inject 7.5 mg into the skin once a week., Disp: 2 mL, Rfl: 0   Cholecalciferol (VITAMIN D3) 50 MCG (2000 UT) TABS, Take by mouth daily., Disp: , Rfl:    docusate sodium  (COLACE) 100 MG capsule, Take 100 mg by mouth daily as needed for mild constipation., Disp: , Rfl:    Eszopiclone  3 MG TABS, Take 1 tablet (3 mg total) by mouth at bedtime as needed. Take immediately before bedtime, Disp: 30 tablet, Rfl: 2   Ferrous Sulfate (IRON  PO), Take 16 mLs by mouth daily., Disp: , Rfl:     Multiple Vitamins-Minerals (MULTIVITAMIN ADULT PO), Take by mouth 2 (two) times daily., Disp: , Rfl:    promethazine  (PHENERGAN ) 12.5 MG tablet, Take 1 tablet (12.5 mg total) by mouth every 4 (four) hours as needed for nausea or vomiting., Disp: 30 tablet, Rfl: 0  Observations/Objective: There were no vitals filed for this visit. Wt Readings from Last 3 Encounters:  08/28/23 175 lb (79.4 kg)  03/21/23 168 lb (76.2 kg)  12/26/22 165 lb 12.8 oz (75.2 kg)   BMI Readings from Last 3 Encounters:  08/28/23 30.04 kg/m  03/21/23 28.84 kg/m  12/26/22 28.46 kg/m      Physical Exam Vitals and nursing note reviewed.  Constitutional:      Appearance: Normal appearance.  HENT:     Head: Normocephalic and atraumatic.  Eyes:     Extraocular Movements: Extraocular movements intact.  Pulmonary:     Effort: Pulmonary effort is normal.  Musculoskeletal:     Cervical back: Normal range of motion.  Neurological:     General: No focal deficit present.     Mental Status: She is alert.  Psychiatric:        Mood and Affect: Mood normal.        Behavior: Behavior normal.     Assessment and Plan: Class 1 obesity due to excess calories without serious comorbidity with body mass index (BMI) of 30.0 to 30.9 in adult Assessment & Plan: She gained 7lbs since January 2025.  She has not weighed  today. She feels she has reached a plateau with Wegovy .  She is still exercising on a regular basis. She would like to switch to Zepbound.  - I will transition her to Zepbound 5mg  weekly x 4 weeks, she will come to pick up samples - I will send rx Zepbound 7.5mg  weekly to start PA process.   - She is reminded to stop eating when full  - She agrees to rto in 8 weeks for an in-person visit.    Perimenopause Assessment & Plan: She is now followed by [facility].  She was recently started on Nextstellis, it is thought this will be helpful with her current symptoms  - Notes in Care Everywhere were reviewed in  detail.   Other orders -     Zepbound; Inject 7.5 mg into the skin once a week.  Dispense: 2 mL; Refill: 0     Follow Up Instructions: Return in about 9 weeks (around 02/06/2024).   I discussed the assessment and treatment plan with the patient. The patient was provided an opportunity to ask questions, and all were answered. The patient agreed with the plan and demonstrated an understanding of the instructions.   The patient was advised to call back or seek an in-person evaluation if the symptoms worsen or if the condition fails to improve as anticipated.  The above assessment and management plan was discussed with the patient. The patient verbalized understanding of and has agreed to the management plan.   I, Diana LOISE Slocumb, MD, have reviewed all documentation for this visit. The documentation on 12/05/23 for the exam, diagnosis, procedures, and orders are all accurate and complete.

## 2023-12-05 NOTE — Patient Instructions (Signed)
 Exercising to Stay Healthy To become healthy and stay healthy, it is recommended that you do moderate-intensity and vigorous-intensity exercise. You can tell that you are exercising at a moderate intensity if your heart starts beating faster and you start breathing faster but can still hold a conversation. You can tell that you are exercising at a vigorous intensity if you are breathing much harder and faster and cannot hold a conversation while exercising. How can exercise benefit me? Exercising regularly is important. It has many health benefits, such as: Improving overall fitness, flexibility, and endurance. Increasing bone density. Helping with weight control. Decreasing body fat. Increasing muscle strength and endurance. Reducing stress and tension, anxiety, depression, or anger. Improving overall health. What guidelines should I follow while exercising? Before you start a new exercise program, talk with your health care provider. Do not exercise so much that you hurt yourself, feel dizzy, or get very short of breath. Wear comfortable clothes and wear shoes with good support. Drink plenty of water while you exercise to prevent dehydration or heat stroke. Work out until your breathing and your heartbeat get faster (moderate intensity). How often should I exercise? Choose an activity that you enjoy, and set realistic goals. Your health care provider can help you make an activity plan that is individually designed and works best for you. Exercise regularly as told by your health care provider. This may include: Doing strength training two times a week, such as: Lifting weights. Using resistance bands. Push-ups. Sit-ups. Yoga. Doing a certain intensity of exercise for a given amount of time. Choose from these options: A total of 150 minutes of moderate-intensity exercise every week. A total of 75 minutes of vigorous-intensity exercise every week. A mix of moderate-intensity and  vigorous-intensity exercise every week. Children, pregnant women, people who have not exercised regularly, people who are overweight, and older adults may need to talk with a health care provider about what activities are safe to perform. If you have a medical condition, be sure to talk with your health care provider before you start a new exercise program. What are some exercise ideas? Moderate-intensity exercise ideas include: Walking 1 mile (1.6 km) in about 15 minutes. Biking. Hiking. Golfing. Dancing. Water aerobics. Vigorous-intensity exercise ideas include: Walking 4.5 miles (7.2 km) or more in about 1 hour. Jogging or running 5 miles (8 km) in about 1 hour. Biking 10 miles (16.1 km) or more in about 1 hour. Lap swimming. Roller-skating or in-line skating. Cross-country skiing. Vigorous competitive sports, such as football, basketball, and soccer. Jumping rope. Aerobic dancing. What are some everyday activities that can help me get exercise? Yard work, such as: Child psychotherapist. Raking and bagging leaves. Washing your car. Pushing a stroller. Shoveling snow. Gardening. Washing windows or floors. How can I be more active in my day-to-day activities? Use stairs instead of an elevator. Take a walk during your lunch break. If you drive, park your car farther away from your work or school. If you take public transportation, get off one stop early and walk the rest of the way. Stand up or walk around during all of your indoor phone calls. Get up, stretch, and walk around every 30 minutes throughout the day. Enjoy exercise with a friend. Support to continue exercising will help you keep a regular routine of activity. Where to find more information You can find more information about exercising to stay healthy from: U.S. Department of Health and Human Services: ThisPath.fi Centers for Disease Control and Prevention (  CDC): FootballExhibition.com.br Summary Exercising regularly is  important. It will improve your overall fitness, flexibility, and endurance. Regular exercise will also improve your overall health. It can help you control your weight, reduce stress, and improve your bone density. Do not exercise so much that you hurt yourself, feel dizzy, or get very short of breath. Before you start a new exercise program, talk with your health care provider. This information is not intended to replace advice given to you by your health care provider. Make sure you discuss any questions you have with your health care provider. Document Revised: 06/19/2020 Document Reviewed: 06/19/2020 Elsevier Patient Education  2024 [employer].

## 2023-12-07 ENCOUNTER — Telehealth: Payer: Self-pay

## 2023-12-07 ENCOUNTER — Encounter: Payer: Self-pay | Admitting: Internal Medicine

## 2023-12-07 NOTE — Assessment & Plan Note (Signed)
 She gained 7lbs since January 2025.  She has not weighed today. She feels she has reached a plateau with Wegovy .  She is still exercising on a regular basis. She would like to switch to Zepbound.  - I will transition her to Zepbound 5mg  weekly x 4 weeks, she will come to pick up samples - I will send rx Zepbound 7.5mg  weekly to start PA process.   - She is reminded to stop eating when full  - She agrees to rto in 8 weeks for an in-person visit.

## 2023-12-07 NOTE — Telephone Encounter (Addendum)
 Completed PA on 10/2. PA approval on file. Prior Authorization is not required.

## 2023-12-07 NOTE — Assessment & Plan Note (Addendum)
 She is now followed by [facility].  She was recently started on Nextstellis, it is thought this will be helpful with her current symptoms  - Notes in Care Everywhere were reviewed in detail.

## 2024-01-04 ENCOUNTER — Other Ambulatory Visit: Payer: Self-pay | Admitting: Internal Medicine

## 2024-02-08 ENCOUNTER — Ambulatory Visit: Payer: PRIVATE HEALTH INSURANCE | Admitting: Internal Medicine

## 2024-02-08 VITALS — BP 110/80 | HR 78 | Temp 98.3°F | Ht 64.0 in | Wt 168.6 lb

## 2024-02-08 DIAGNOSIS — E663 Overweight: Secondary | ICD-10-CM | POA: Diagnosis not present

## 2024-02-08 DIAGNOSIS — E6609 Other obesity due to excess calories: Secondary | ICD-10-CM

## 2024-02-08 DIAGNOSIS — L659 Nonscarring hair loss, unspecified: Secondary | ICD-10-CM | POA: Diagnosis not present

## 2024-02-08 DIAGNOSIS — Z6828 Body mass index (BMI) 28.0-28.9, adult: Secondary | ICD-10-CM

## 2024-02-08 MED ORDER — ZEPBOUND 10 MG/0.5ML ~~LOC~~ SOAJ: 10.0000 mg | SUBCUTANEOUS | 2 refills | Status: AC

## 2024-02-08 NOTE — Patient Instructions (Signed)

## 2024-02-08 NOTE — Progress Notes (Signed)
 I,Diana Craig, CMA,acting as a neurosurgeon for Diana LOISE Slocumb, MD.,have documented all relevant documentation on the behalf of Diana LOISE Slocumb, MD,as directed by  Diana LOISE Slocumb, MD while in the presence of Diana LOISE Slocumb, MD.  Subjective:  Patient ID: Diana Craig , female    DOB: 11/05/1975 , 48 y.o.   MRN: [id number]  Chief Complaint  Patient presents with   Weight Check    Patient presents today for weight check. She reports compliance with Zepbound  7.5MG . she would like to go up.  She would like a referral to derm. She has noticed a spot in her hair of thinning. She does not know if this is due to hormones. She wants to be cautious.   She presents today for f/u obesity. She is doing well with Zepbound  7.5mg  weekly. She is ready to increase her dose.  No current issues. She is exercising regularly and optimizing her protein intake.     Past Medical History:  Diagnosis Date   Anxiety    GERD (gastroesophageal reflux disease)    Headache(784.0)    menstrual cycle related   IDA (iron  deficiency anemia)    Uterine fibroid      Family History  Problem Relation Age of Onset   COPD Mother    Hypertension Mother    Other Father        health status unknown   Breast cancer Neg Hx      Current Outpatient Medications:    Cholecalciferol (VITAMIN D3) 50 MCG (2000 UT) TABS, Take by mouth daily., Disp: , Rfl:    docusate sodium  (COLACE) 100 MG capsule, Take 100 mg by mouth daily as needed for mild constipation., Disp: , Rfl:    Eszopiclone  3 MG TABS, Take 1 tablet (3 mg total) by mouth at bedtime as needed. Take immediately before bedtime, Disp: 30 tablet, Rfl: 2   Ferrous Sulfate (IRON  PO), Take 16 mLs by mouth daily., Disp: , Rfl:    Multiple Vitamins-Minerals (MULTIVITAMIN ADULT PO), Take by mouth 2 (two) times daily., Disp: , Rfl:    promethazine  (PHENERGAN ) 12.5 MG tablet, Take 1 tablet (12.5 mg total) by mouth every 4 (four) hours as needed for nausea or vomiting.,  Disp: 30 tablet, Rfl: 0   tirzepatide  (ZEPBOUND ) 10 MG/0.5ML Pen, Inject 10 mg into the skin once a week., Disp: 2 mL, Rfl: 2   No Known Allergies   Review of Systems  Constitutional: Negative.   Respiratory: Negative.    Cardiovascular: Negative.   Gastrointestinal: Negative.   Neurological: Negative.   Psychiatric/Behavioral: Negative.       Today's Vitals   02/08/24 1600  BP: 110/80  Pulse: 78  Temp: 98.3 F (36.8 C)  SpO2: 98%  Weight: 168 lb 9.6 oz (76.5 kg)  Height: 5' 4 (1.626 m)   Body mass index is 28.94 kg/m.  Wt Readings from Last 3 Encounters:  02/08/24 168 lb 9.6 oz (76.5 kg)  08/28/23 175 lb (79.4 kg)  03/21/23 168 lb (76.2 kg)     Objective:  Physical Exam Vitals and nursing note reviewed.  Constitutional:      Appearance: Normal appearance.  HENT:     Head: Normocephalic and atraumatic.  Eyes:     Extraocular Movements: Extraocular movements intact.  Cardiovascular:     Rate and Rhythm: Normal rate and regular rhythm.     Heart sounds: Normal heart sounds.  Pulmonary:     Effort: Pulmonary effort is normal.  Breath sounds: Normal breath sounds.  Musculoskeletal:     Cervical back: Normal range of motion.  Skin:    General: Skin is warm.     Comments: Frontal area w/ hair loss No erythema or scales  Neurological:     General: No focal deficit present.     Mental Status: She is alert.  Psychiatric:        Mood and Affect: Mood normal.        Behavior: Behavior normal.         Assessment And Plan:  Overweight with body mass index (BMI) of 28 to 28.9 in adult Assessment & Plan: She has lost 9lbs since June 2025. She was previously on Wegovy , switched to Zepbound  after hitting a plateau.  She is doing well on current meds.  - Will increase dose to 10mg  as requested.  - Follow up 10-12 weeks.  - Continue to incorporate strength training into her workouts and optimize protein intake.    Hair loss Assessment & Plan: Possibly  related to hormonal changes associated with perimenopause.  - I will check labs as below. - Will supplement as needed, continue with Nutrafol.   Orders: -     ANA, IFA (with reflex) -     CBC -     Iron , TIBC and Ferritin Panel  Other orders -     Zepbound ; Inject 10 mg into the skin once a week.  Dispense: 2 mL; Refill: 2   Return in 12 weeks (on 05/02/2024), or weight check.  Patient was given opportunity to ask questions. Patient verbalized understanding of the plan and was able to repeat key elements of the plan. All questions were answered to their satisfaction.   I, Diana LOISE Slocumb, MD, have reviewed all documentation for this visit. The documentation on 02/08/24 for the exam, diagnosis, procedures, and orders are all accurate and complete.   IF YOU HAVE BEEN REFERRED TO A SPECIALIST, IT MAY TAKE 1-2 WEEKS TO SCHEDULE/PROCESS THE REFERRAL. IF YOU HAVE NOT HEARD FROM US /SPECIALIST IN TWO WEEKS, PLEASE GIVE US  A CALL AT [phone] X [phone].   THE PATIENT IS ENCOURAGED TO PRACTICE SOCIAL DISTANCING DUE TO THE COVID-19 PANDEMIC.

## 2024-02-09 LAB — IRON,TIBC AND FERRITIN PANEL
Ferritin: 53 ng/mL (ref 15–150)
Iron Saturation: 22 % (ref 15–55)
Iron: 62 ug/dL (ref 27–159)
Total Iron Binding Capacity: 283 ug/dL (ref 250–450)
UIBC: 221 ug/dL (ref 131–425)

## 2024-02-10 LAB — CBC
Hematocrit: 42.4 % (ref 34.0–46.6)
Hemoglobin: 13.5 g/dL (ref 11.1–15.9)
MCH: 29.3 pg (ref 26.6–33.0)
MCHC: 31.8 g/dL (ref 31.5–35.7)
MCV: 92 fL (ref 79–97)
Platelets: 283 x10E3/uL (ref 150–450)
RBC: 4.61 x10E6/uL (ref 3.77–5.28)
RDW: 12.7 % (ref 11.7–15.4)
WBC: 3.9 x10E3/uL (ref 3.4–10.8)

## 2024-02-10 LAB — ANTINUCLEAR ANTIBODIES, IFA: ANA Titer 1: NEGATIVE

## 2024-02-11 NOTE — Assessment & Plan Note (Signed)
 She has lost 9lbs since June 2025. She was previously on Wegovy , switched to Zepbound  after hitting a plateau.  She is doing well on current meds.  - Will increase dose to 10mg  as requested.  - Follow up 10-12 weeks.  - Continue to incorporate strength training into her workouts and optimize protein intake.

## 2024-02-11 NOTE — Assessment & Plan Note (Signed)
 Possibly related to hormonal changes associated with perimenopause.  - I will check labs as below. - Will supplement as needed, continue with Nutrafol.

## 2024-02-12 ENCOUNTER — Ambulatory Visit: Payer: Self-pay | Admitting: Internal Medicine

## 2024-02-19 ENCOUNTER — Other Ambulatory Visit: Payer: Self-pay | Admitting: Internal Medicine

## 2024-03-29 ENCOUNTER — Encounter: Payer: Self-pay | Admitting: Internal Medicine

## 2024-05-06 ENCOUNTER — Ambulatory Visit: Payer: PRIVATE HEALTH INSURANCE | Admitting: Internal Medicine

## 2024-08-29 ENCOUNTER — Encounter: Payer: Self-pay | Admitting: Internal Medicine
